# Patient Record
Sex: Female | Born: 1950 | Race: White | Hispanic: No | Marital: Married | Smoking: Never smoker
Health system: Southern US, Community
[De-identification: ages and names within clinical notes are randomized; demographics above are authoritative.]

## PROBLEM LIST (undated history)

## (undated) DIAGNOSIS — E7800 Pure hypercholesterolemia, unspecified: Secondary | ICD-10-CM

## (undated) DIAGNOSIS — R002 Palpitations: Secondary | ICD-10-CM

## (undated) DIAGNOSIS — W19XXXA Unspecified fall, initial encounter: Secondary | ICD-10-CM

## (undated) DIAGNOSIS — E079 Disorder of thyroid, unspecified: Secondary | ICD-10-CM

## (undated) DIAGNOSIS — K222 Esophageal obstruction: Secondary | ICD-10-CM

## (undated) DIAGNOSIS — B009 Herpesviral infection, unspecified: Secondary | ICD-10-CM

## (undated) DIAGNOSIS — S42309A Unspecified fracture of shaft of humerus, unspecified arm, initial encounter for closed fracture: Secondary | ICD-10-CM

## (undated) DIAGNOSIS — R011 Cardiac murmur, unspecified: Secondary | ICD-10-CM

## (undated) DIAGNOSIS — D649 Anemia, unspecified: Secondary | ICD-10-CM

## (undated) HISTORY — DX: Cardiac murmur, unspecified: R01.1

## (undated) HISTORY — DX: Palpitations: R00.2

## (undated) HISTORY — DX: Herpesviral infection, unspecified: B00.9

## (undated) HISTORY — DX: Disorder of thyroid, unspecified: E07.9

## (undated) HISTORY — DX: Pure hypercholesterolemia, unspecified: E78.00

## (undated) HISTORY — DX: Unspecified fracture of shaft of humerus, unspecified arm, initial encounter for closed fracture: S42.309A

## (undated) HISTORY — DX: Anemia, unspecified: D64.9

## (undated) HISTORY — PX: OTHER SURGICAL HISTORY: SHX169

## (undated) HISTORY — DX: Esophageal obstruction: K22.2

---

## 1987-06-02 HISTORY — PX: PELVIC LAPAROSCOPY: SHX162

## 1998-05-07 ENCOUNTER — Other Ambulatory Visit: Admission: RE | Admit: 1998-05-07 | Discharge: 1998-05-07 | Payer: Self-pay | Admitting: Obstetrics and Gynecology

## 1999-07-21 ENCOUNTER — Encounter (INDEPENDENT_AMBULATORY_CARE_PROVIDER_SITE_OTHER): Payer: Self-pay | Admitting: Specialist

## 1999-07-21 ENCOUNTER — Other Ambulatory Visit: Admission: RE | Admit: 1999-07-21 | Discharge: 1999-07-21 | Payer: Self-pay | Admitting: Obstetrics and Gynecology

## 2000-02-17 ENCOUNTER — Other Ambulatory Visit: Admission: RE | Admit: 2000-02-17 | Discharge: 2000-02-17 | Payer: Self-pay | Admitting: Obstetrics and Gynecology

## 2000-07-28 ENCOUNTER — Encounter (INDEPENDENT_AMBULATORY_CARE_PROVIDER_SITE_OTHER): Payer: Self-pay | Admitting: Specialist

## 2000-07-28 ENCOUNTER — Ambulatory Visit (HOSPITAL_COMMUNITY): Admission: RE | Admit: 2000-07-28 | Discharge: 2000-07-28 | Payer: Self-pay | Admitting: Gastroenterology

## 2001-02-17 ENCOUNTER — Other Ambulatory Visit: Admission: RE | Admit: 2001-02-17 | Discharge: 2001-02-17 | Payer: Self-pay | Admitting: Obstetrics and Gynecology

## 2001-07-28 ENCOUNTER — Emergency Department (HOSPITAL_COMMUNITY): Admission: EM | Admit: 2001-07-28 | Discharge: 2001-07-28 | Payer: Self-pay

## 2002-02-17 ENCOUNTER — Other Ambulatory Visit: Admission: RE | Admit: 2002-02-17 | Discharge: 2002-02-17 | Payer: Self-pay | Admitting: Obstetrics and Gynecology

## 2003-02-27 ENCOUNTER — Encounter: Admission: RE | Admit: 2003-02-27 | Discharge: 2003-05-28 | Payer: Self-pay | Admitting: Obstetrics and Gynecology

## 2003-02-28 ENCOUNTER — Other Ambulatory Visit: Admission: RE | Admit: 2003-02-28 | Discharge: 2003-02-28 | Payer: Self-pay | Admitting: Obstetrics and Gynecology

## 2004-01-31 ENCOUNTER — Ambulatory Visit (HOSPITAL_COMMUNITY): Admission: RE | Admit: 2004-01-31 | Discharge: 2004-01-31 | Payer: Self-pay | Admitting: Gastroenterology

## 2004-01-31 ENCOUNTER — Encounter (INDEPENDENT_AMBULATORY_CARE_PROVIDER_SITE_OTHER): Payer: Self-pay | Admitting: Specialist

## 2004-02-29 ENCOUNTER — Other Ambulatory Visit: Admission: RE | Admit: 2004-02-29 | Discharge: 2004-02-29 | Payer: Self-pay | Admitting: Obstetrics and Gynecology

## 2005-03-09 ENCOUNTER — Other Ambulatory Visit: Admission: RE | Admit: 2005-03-09 | Discharge: 2005-03-09 | Payer: Self-pay | Admitting: Obstetrics and Gynecology

## 2005-06-25 ENCOUNTER — Encounter: Admission: RE | Admit: 2005-06-25 | Discharge: 2005-09-23 | Payer: Self-pay | Admitting: Obstetrics and Gynecology

## 2005-10-27 ENCOUNTER — Encounter: Payer: Self-pay | Admitting: Cardiology

## 2006-03-17 ENCOUNTER — Other Ambulatory Visit: Admission: RE | Admit: 2006-03-17 | Discharge: 2006-03-17 | Payer: Self-pay | Admitting: Obstetrics and Gynecology

## 2006-06-18 ENCOUNTER — Ambulatory Visit: Payer: Self-pay | Admitting: Internal Medicine

## 2006-09-06 ENCOUNTER — Ambulatory Visit: Payer: Self-pay | Admitting: Internal Medicine

## 2006-11-11 ENCOUNTER — Emergency Department (HOSPITAL_COMMUNITY): Admission: EM | Admit: 2006-11-11 | Discharge: 2006-11-11 | Payer: Self-pay | Admitting: Emergency Medicine

## 2006-11-12 ENCOUNTER — Emergency Department (HOSPITAL_COMMUNITY): Admission: EM | Admit: 2006-11-12 | Discharge: 2006-11-12 | Payer: Self-pay | Admitting: Emergency Medicine

## 2007-03-22 ENCOUNTER — Other Ambulatory Visit: Admission: RE | Admit: 2007-03-22 | Discharge: 2007-03-22 | Payer: Self-pay | Admitting: Obstetrics and Gynecology

## 2007-06-01 ENCOUNTER — Encounter: Payer: Self-pay | Admitting: Cardiovascular Disease

## 2007-06-01 ENCOUNTER — Encounter: Payer: Self-pay | Admitting: Cardiology

## 2008-04-16 ENCOUNTER — Other Ambulatory Visit: Admission: RE | Admit: 2008-04-16 | Discharge: 2008-04-16 | Payer: Self-pay | Admitting: Obstetrics and Gynecology

## 2008-04-16 ENCOUNTER — Encounter: Payer: Self-pay | Admitting: Obstetrics and Gynecology

## 2008-04-16 ENCOUNTER — Ambulatory Visit: Payer: Self-pay | Admitting: Obstetrics and Gynecology

## 2008-04-17 ENCOUNTER — Ambulatory Visit: Payer: Self-pay | Admitting: Obstetrics and Gynecology

## 2008-08-21 ENCOUNTER — Ambulatory Visit: Payer: Self-pay | Admitting: Obstetrics and Gynecology

## 2008-12-25 ENCOUNTER — Ambulatory Visit: Payer: Self-pay | Admitting: Obstetrics and Gynecology

## 2009-04-30 ENCOUNTER — Ambulatory Visit: Payer: Self-pay | Admitting: Obstetrics and Gynecology

## 2009-04-30 ENCOUNTER — Other Ambulatory Visit: Admission: RE | Admit: 2009-04-30 | Discharge: 2009-04-30 | Payer: Self-pay | Admitting: Obstetrics and Gynecology

## 2009-09-10 ENCOUNTER — Ambulatory Visit: Payer: Self-pay | Admitting: Obstetrics and Gynecology

## 2010-03-10 ENCOUNTER — Ambulatory Visit: Payer: Self-pay | Admitting: Obstetrics and Gynecology

## 2010-05-06 ENCOUNTER — Other Ambulatory Visit
Admission: RE | Admit: 2010-05-06 | Discharge: 2010-05-06 | Payer: Self-pay | Source: Home / Self Care | Admitting: Obstetrics and Gynecology

## 2010-05-06 ENCOUNTER — Ambulatory Visit: Payer: Self-pay | Admitting: Obstetrics and Gynecology

## 2010-05-07 ENCOUNTER — Ambulatory Visit: Payer: Self-pay | Admitting: Obstetrics and Gynecology

## 2010-09-19 ENCOUNTER — Ambulatory Visit (INDEPENDENT_AMBULATORY_CARE_PROVIDER_SITE_OTHER): Payer: BC Managed Care – PPO | Admitting: Gynecology

## 2010-09-19 DIAGNOSIS — N63 Unspecified lump in unspecified breast: Secondary | ICD-10-CM

## 2010-09-25 ENCOUNTER — Telehealth: Payer: Self-pay | Admitting: Cardiology

## 2010-09-25 NOTE — Telephone Encounter (Signed)
Sleep Apnia is getting worse. Wanted to speak with someone about getting a referral to a sleep apnia specialist. Please call back.

## 2010-09-30 NOTE — Telephone Encounter (Signed)
Left a message on 4/27 giving patient information of who we referred her to previously.  That appointment she cancelled, however I asked her to call them regarding rescheduling.  I asked her to call back if there was a problem with getting the appointment.  As of today, I have not heard back from the patient.  Pulmonologist referred to was Dr. Melba Coon.

## 2010-10-03 ENCOUNTER — Other Ambulatory Visit: Payer: Self-pay | Admitting: *Deleted

## 2010-10-03 DIAGNOSIS — G4700 Insomnia, unspecified: Secondary | ICD-10-CM

## 2010-10-03 MED ORDER — ZOLPIDEM TARTRATE 5 MG PO TABS: 5.0000 mg | ORAL_TABLET | Freq: Every evening | ORAL | Status: DC | PRN

## 2010-10-03 NOTE — Telephone Encounter (Signed)
Refilled meds per fax request.  

## 2010-10-17 NOTE — Op Note (Signed)
NAME:  Kelly Hahn, LANGHORNE                     ACCOUNT NO.:  [id number]   MEDICAL RECORD NO.:  [id number]                   PATIENT TYPE:  AMB   LOCATION:  ENDO                                 FACILITY:  [facility]   PHYSICIAN:  Bernette Redbird, M.D.                DATE OF BIRTH:  22-Jan-1951   DATE OF PROCEDURE:  01/31/2004  DATE OF DISCHARGE:                                 OPERATIVE REPORT   PROCEDURE:  Colonoscopy with biopsies.   INDICATION:  A 60 year old with family history of colon cancer in remote  relatives and a personal history of a small colonic adenoma having been  removed at the time of her previous colonoscopy about 3 years ago.   FINDINGS:  Diminutive rectal polyp, removed.   DESCRIPTION OF PROCEDURE:  The nature, purpose, and risks of the procedure  were familiar to the patient from prior examination.  She provided written  consent.  Sedation was fentanyl 62.5 mcg and Versed 8 mg IV without  arrhythmias or desaturation.  The Olympus adjustable tension pediatric video  colonoscope was advanced with just a little bit of looping overcome by  external abdominal compression, reaching the terminal ileum which had a  normal appearance, and pullback was then performed.   The quality of the prep was excellent, and it is felt that all areas are  well seen.   There was a tiny, 2-3 mm hyperplastic-appearing sessile polyp on a fold in  the rectum at about 12 cm, removed by a couple of cold biopsies.  No other  polyps were seen, and there was no evidence of cancer, colitis, vascular  malformations, or diverticulosis.  Retroflexion in the rectum was  unremarkable.   The patient tolerated the procedure well, and there were no apparent  complications.   IMPRESSION:  1.  Diminutive rectal polyp (211.4).  2.  Prior history of colonic  adenoma.  3.  Family history of colon cancer.   PLAN:  Await pathology on the polyp with anticipated colonoscopic follow up  in 5 years in view of  the prior history of an adenoma having been removed,  and in view of the family history of colon cancer.                                               Bernette Redbird, M.D.    RB/MEDQ  D:  01/31/2004  T:  01/31/2004  Job:  [id number]   cc:   Reuel Boom L. Eda Paschal, M.D.  [address]  [place]  [place] [address]  Fax: [phone]   Titus Dubin. Alwyn Ren, M.D. [facility]

## 2010-10-17 NOTE — Procedures (Signed)
[facility]  Patient:    Kelly Hahn, Kelly Hahn                  MRN: [id number] Proc. Date: 07/28/00 Adm. Date:  [id number] Attending:  Rich Brave CC:         Rande Brunt. Eda Paschal, M.D.   Procedure Report  PROCEDURE:  Colonoscopy with polypectomy.  ENDOSCOPIST:  Florencia Reasons, M.D.  INDICATIONS:  A 60 year old female for colon cancer screening.  There is a family history of colorectal neoplasia in her maternal aunt and maternal grandmother.  The patient herself is asymptomatic.  FINDINGS:  Medium size polyp at 25 cm, removed by snare technique.  DESCRIPTION OF PROCEDURE:  The nature, purpose, and risks of the procedure had been discussed with the patient who provided written consent.  Sedation was fentanyl 62.5 mcg and Versed 8 mg IV without arrhythmias or desaturation.  The Olympus pediatric video colonoscope was advanced with some looping to the cecum as identified by the clear visualization of the appendiceal orifice and the absence of further lumen.  Pullback was then performed.  The quality of the prep was excellent, and it is felt that all areas were well seen.  There was an 8 mm polyp snared at about 25 cm.  It was retrieved by suctioning through the scope.  There was complete hemostasis and no evidence of excessive cautery.  This was otherwise a normal examination, without other polyps being seen, and without any evidence of cancer, colitis, vascular malformations, or diverticulosis.  Retroflexion in the rectum was normal.  The patient tolerated the procedure well, and there were no apparent complications.  IMPRESSION:  Sigmoid polyp removed as described above.  PLAN:  Await pathology results. DD:  07/28/00 TD:  07/28/00 Job: [id number]

## 2010-12-26 ENCOUNTER — Other Ambulatory Visit: Payer: Self-pay | Admitting: Family Medicine

## 2010-12-26 ENCOUNTER — Ambulatory Visit
Admission: RE | Admit: 2010-12-26 | Discharge: 2010-12-26 | Disposition: A | Discharge status: Home / Self Care | Payer: BC Managed Care – PPO | Source: Ambulatory Visit | Attending: Family Medicine | Admitting: Family Medicine

## 2010-12-26 DIAGNOSIS — R413 Other amnesia: Secondary | ICD-10-CM

## 2010-12-26 DIAGNOSIS — R410 Disorientation, unspecified: Secondary | ICD-10-CM

## 2010-12-26 MED ORDER — IOHEXOL 300 MG/ML  SOLN
75.0000 mL | Freq: Once | INTRAMUSCULAR | Status: AC | PRN
  Administered 2010-12-26: 75 mL via INTRAVENOUS

## 2011-01-07 ENCOUNTER — Telehealth: Payer: Self-pay | Admitting: Cardiology

## 2011-01-07 DIAGNOSIS — F419 Anxiety disorder, unspecified: Secondary | ICD-10-CM

## 2011-01-07 NOTE — Telephone Encounter (Signed)
Called because she is not comfortable taking the zanex and would like your opinion on taking Adavan. Please call back. I have pulled her chart.

## 2011-01-07 NOTE — Telephone Encounter (Signed)
Please advise 

## 2011-01-07 NOTE — Telephone Encounter (Signed)
Okay to try Ativan 0.5 mg bid prn anxiety #60 ref 2

## 2011-01-07 NOTE — Telephone Encounter (Signed)
Advised patient

## 2011-01-08 MED ORDER — LORAZEPAM 0.5 MG PO TABS: 0.5000 mg | ORAL_TABLET | Freq: Two times a day (BID) | ORAL | Status: AC | PRN

## 2011-03-25 ENCOUNTER — Other Ambulatory Visit: Payer: Self-pay | Admitting: Dermatology

## 2011-05-08 ENCOUNTER — Ambulatory Visit (INDEPENDENT_AMBULATORY_CARE_PROVIDER_SITE_OTHER): Payer: BC Managed Care – PPO

## 2011-05-08 DIAGNOSIS — R509 Fever, unspecified: Secondary | ICD-10-CM

## 2011-05-08 DIAGNOSIS — R05 Cough: Secondary | ICD-10-CM

## 2011-05-08 DIAGNOSIS — R059 Cough, unspecified: Secondary | ICD-10-CM

## 2011-05-08 DIAGNOSIS — IMO0001 Reserved for inherently not codable concepts without codable children: Secondary | ICD-10-CM

## 2011-05-12 ENCOUNTER — Other Ambulatory Visit: Payer: Self-pay | Admitting: Emergency Medicine

## 2011-05-12 DIAGNOSIS — E041 Nontoxic single thyroid nodule: Secondary | ICD-10-CM

## 2011-05-13 ENCOUNTER — Ambulatory Visit
Admission: RE | Admit: 2011-05-13 | Discharge: 2011-05-13 | Disposition: A | Discharge status: Home / Self Care | Payer: BC Managed Care – PPO | Source: Ambulatory Visit | Attending: Emergency Medicine | Admitting: Emergency Medicine

## 2011-05-13 DIAGNOSIS — E041 Nontoxic single thyroid nodule: Secondary | ICD-10-CM

## 2011-05-21 ENCOUNTER — Encounter: Payer: BC Managed Care – PPO | Admitting: Obstetrics and Gynecology

## 2011-05-22 ENCOUNTER — Ambulatory Visit (INDEPENDENT_AMBULATORY_CARE_PROVIDER_SITE_OTHER): Payer: BC Managed Care – PPO

## 2011-05-22 DIAGNOSIS — R110 Nausea: Secondary | ICD-10-CM

## 2011-05-29 ENCOUNTER — Encounter: Payer: Self-pay | Admitting: *Deleted

## 2011-05-29 DIAGNOSIS — N809 Endometriosis, unspecified: Secondary | ICD-10-CM | POA: Insufficient documentation

## 2011-06-04 ENCOUNTER — Ambulatory Visit (INDEPENDENT_AMBULATORY_CARE_PROVIDER_SITE_OTHER): Payer: BC Managed Care – PPO | Admitting: Obstetrics and Gynecology

## 2011-06-04 ENCOUNTER — Encounter: Payer: Self-pay | Admitting: Obstetrics and Gynecology

## 2011-06-04 VITALS — BP 112/66 | Ht 64.5 in | Wt 134.0 lb

## 2011-06-04 DIAGNOSIS — M810 Age-related osteoporosis without current pathological fracture: Secondary | ICD-10-CM

## 2011-06-04 DIAGNOSIS — Z01419 Encounter for gynecological examination (general) (routine) without abnormal findings: Secondary | ICD-10-CM

## 2011-06-04 LAB — URINALYSIS, ROUTINE W REFLEX MICROSCOPIC
Bilirubin Urine: NEGATIVE
Glucose, UA: NEGATIVE mg/dL
Hgb urine dipstick: NEGATIVE
Ketones, ur: NEGATIVE mg/dL
Leukocytes, UA: NEGATIVE
Nitrite: NEGATIVE
Protein, ur: NEGATIVE mg/dL
Specific Gravity, Urine: 1.03 (ref 1.005–1.030)
Urobilinogen, UA: 0.2 mg/dL (ref 0.0–1.0)
pH: 5.5 (ref 5.0–8.0)

## 2011-06-04 NOTE — Progress Notes (Signed)
Patient came to see me today for her annual GYN exam. She is doing well without menopausal symptoms. She continues to morn the death of her son. She is having no vaginal bleeding. She is having no pelvic pain. She did have a fracture this year of her arm but it was a traumatic fracture. She continues to take calcium and vitamin D. She told me today that she is still not willing to take medicine for her osteoporosis. She is well aware of the risks of a fracture. She is due for yearly mammogram. Dr. Cleta Alberts found some small thyroid nodules that he is watching. She had an ultrasound. She is going to back in 6 months for followup ultra sound. Her thyroid function was normal.  Physical examination: Kennon Portela present. HEENT within normal limits. Neck: Thyroid slightly enlarged Supraclavicular nodes: not enlarged. Breasts: Examined in both sitting midline position. No skin changes and no masses. Abdomen: Soft no guarding rebound or masses or hernia. Pelvic: External: Within normal limits. BUS: Within normal limits. Vaginal:within normal limits. Good estrogen effect. No evidence of cystocele rectocele or enterocele. Cervix: clean. Uterus: Normal size and shape. Adnexa: No masses. Rectovaginal exam: Confirmatory and negative. Extremities: Within normal limits.  Assessment: Normal GYN exam. Small thyroid nodules.  Plan: As noted above I once again encouraged patient to take medication for osteoporosis. She declined. She will continue Dr. Cleta Alberts follow her thyroid. She will get yearly mammogram. She will return fasting tomorrow for her lab work.

## 2011-06-05 ENCOUNTER — Other Ambulatory Visit: Payer: BC Managed Care – PPO

## 2011-06-05 DIAGNOSIS — M810 Age-related osteoporosis without current pathological fracture: Secondary | ICD-10-CM

## 2011-06-05 DIAGNOSIS — Z01419 Encounter for gynecological examination (general) (routine) without abnormal findings: Secondary | ICD-10-CM

## 2011-06-05 LAB — LIPID PANEL
Cholesterol: 241 mg/dL — ABNORMAL HIGH (ref 0–200)
HDL: 58 mg/dL (ref >39)
LDL Cholesterol: 160 mg/dL — ABNORMAL HIGH (ref 0–99)
Total CHOL/HDL Ratio: 4.2 Ratio
Triglycerides: 116 mg/dL (ref <150)
VLDL: 23 mg/dL (ref 0–40)

## 2011-06-05 LAB — CBC WITH DIFFERENTIAL/PLATELET
Basophils Absolute: 0 10*3/uL (ref 0.0–0.1)
Basophils Relative: 0 % (ref 0–1)
Eosinophils Absolute: 0.2 10*3/uL (ref 0.0–0.7)
Eosinophils Relative: 3 % (ref 0–5)
HCT: 43.1 % (ref 36.0–46.0)
Hemoglobin: 13.7 g/dL (ref 12.0–15.0)
Lymphocytes Relative: 42 % (ref 12–46)
Lymphs Abs: 2.4 10*3/uL (ref 0.7–4.0)
MCH: 27.1 pg (ref 26.0–34.0)
MCHC: 31.8 g/dL (ref 30.0–36.0)
MCV: 85.2 fL (ref 78.0–100.0)
Monocytes Absolute: 0.6 10*3/uL (ref 0.1–1.0)
Monocytes Relative: 11 % (ref 3–12)
Neutro Abs: 2.5 10*3/uL (ref 1.7–7.7)
Neutrophils Relative %: 44 % (ref 43–77)
Platelets: 283 10*3/uL (ref 150–400)
RBC: 5.06 MIL/uL (ref 3.87–5.11)
RDW: 14.5 % (ref 11.5–15.5)
WBC: 5.7 10*3/uL (ref 4.0–10.5)

## 2011-06-06 LAB — VITAMIN D 25 HYDROXY (VIT D DEFICIENCY, FRACTURES): Vit D, 25-Hydroxy: 57 ng/mL (ref 30–89)

## 2011-06-16 ENCOUNTER — Encounter: Payer: Self-pay | Admitting: Obstetrics and Gynecology

## 2011-07-22 ENCOUNTER — Telehealth: Payer: Self-pay | Admitting: Cardiology

## 2011-07-22 NOTE — Telephone Encounter (Signed)
New Problem  Please return call to patient regarding Sleep study appnt, she can be reached on cell# [phone]

## 2011-07-22 NOTE — Telephone Encounter (Signed)
Patient see's Dr Cleta Alberts for PCP and will call them and request them make the referral.  We have not seen the patient in several years and chart no longer on site.  Patient stated she would call Dr Ellis Parents office

## 2011-07-24 ENCOUNTER — Telehealth: Payer: Self-pay

## 2011-07-24 NOTE — Telephone Encounter (Signed)
Pt is calling to speak with someone about her sleep apnea problems she is waking up at night and has stopped breathing  Best number [phone]

## 2011-07-26 NOTE — Telephone Encounter (Signed)
LMOM to CB. 

## 2011-07-27 ENCOUNTER — Telehealth: Payer: Self-pay

## 2011-07-27 NOTE — Telephone Encounter (Signed)
.  [facility]     PT IS REQUESTING REFERAL FROM DR DAUB FOR SLEEP APNEA   BEST PHONE [phone]

## 2011-07-27 NOTE — Telephone Encounter (Signed)
Spoke with Kelly Hahn and advised to RTC to see Dr. Cleta Alberts first and then possibly refer her to have a sleep study done. Transferred to 104 to make appt

## 2011-09-17 DIAGNOSIS — R0683 Snoring: Secondary | ICD-10-CM | POA: Insufficient documentation

## 2011-10-14 ENCOUNTER — Encounter: Payer: Self-pay | Admitting: *Deleted

## 2011-12-08 ENCOUNTER — Ambulatory Visit (INDEPENDENT_AMBULATORY_CARE_PROVIDER_SITE_OTHER): Payer: BC Managed Care – PPO | Admitting: Emergency Medicine

## 2011-12-08 VITALS — BP 103/68 | HR 61 | Temp 97.4°F | Resp 16 | Ht 65.5 in | Wt 130.0 lb

## 2011-12-08 DIAGNOSIS — Z634 Disappearance and death of family member: Secondary | ICD-10-CM | POA: Insufficient documentation

## 2011-12-08 DIAGNOSIS — Z0000 Encounter for general adult medical examination without abnormal findings: Secondary | ICD-10-CM

## 2011-12-08 DIAGNOSIS — E049 Nontoxic goiter, unspecified: Secondary | ICD-10-CM

## 2011-12-08 DIAGNOSIS — R945 Abnormal results of liver function studies: Secondary | ICD-10-CM

## 2011-12-08 DIAGNOSIS — I493 Ventricular premature depolarization: Secondary | ICD-10-CM | POA: Insufficient documentation

## 2011-12-08 DIAGNOSIS — E785 Hyperlipidemia, unspecified: Secondary | ICD-10-CM

## 2011-12-08 DIAGNOSIS — F4321 Adjustment disorder with depressed mood: Secondary | ICD-10-CM | POA: Insufficient documentation

## 2011-12-08 LAB — IBC PANEL
%SAT: 33 % (ref 20–55)
TIBC: 412 ug/dL (ref 250–470)
UIBC: 277 ug/dL (ref 125–400)

## 2011-12-08 LAB — CBC WITH DIFFERENTIAL/PLATELET
Basophils Absolute: 0 10*3/uL (ref 0.0–0.1)
Basophils Relative: 1 % (ref 0–1)
Eosinophils Absolute: 0.1 10*3/uL (ref 0.0–0.7)
Eosinophils Relative: 1 % (ref 0–5)
HCT: 42 % (ref 36.0–46.0)
Hemoglobin: 14.4 g/dL (ref 12.0–15.0)
Lymphocytes Relative: 22 % (ref 12–46)
Lymphs Abs: 1.3 10*3/uL (ref 0.7–4.0)
MCH: 27.3 pg (ref 26.0–34.0)
MCHC: 34.3 g/dL (ref 30.0–36.0)
MCV: 79.5 fL (ref 78.0–100.0)
Monocytes Absolute: 0.5 10*3/uL (ref 0.1–1.0)
Monocytes Relative: 8 % (ref 3–12)
Neutro Abs: 3.9 10*3/uL (ref 1.7–7.7)
Neutrophils Relative %: 68 % (ref 43–77)
Platelets: 278 10*3/uL (ref 150–400)
RBC: 5.28 MIL/uL — ABNORMAL HIGH (ref 3.87–5.11)
RDW: 13.5 % (ref 11.5–15.5)
WBC: 5.8 10*3/uL (ref 4.0–10.5)

## 2011-12-08 LAB — POCT URINALYSIS DIPSTICK
Bilirubin, UA: NEGATIVE
Blood, UA: NEGATIVE
Glucose, UA: NEGATIVE
Ketones, UA: NEGATIVE
Leukocytes, UA: NEGATIVE
Nitrite, UA: NEGATIVE
Protein, UA: NEGATIVE
Spec Grav, UA: 1.015
Urobilinogen, UA: 0.2
pH, UA: 7

## 2011-12-08 LAB — COMPREHENSIVE METABOLIC PANEL
ALT: 26 U/L (ref 0–35)
AST: 24 U/L (ref 0–37)
Albumin: 4.4 g/dL (ref 3.5–5.2)
Alkaline Phosphatase: 70 U/L (ref 39–117)
BUN: 15 mg/dL (ref 6–23)
CO2: 28 mEq/L (ref 19–32)
Calcium: 9.5 mg/dL (ref 8.4–10.5)
Chloride: 104 mEq/L (ref 96–112)
Creat: 0.62 mg/dL (ref 0.50–1.10)
Glucose, Bld: 93 mg/dL (ref 70–99)
Potassium: 4.3 mEq/L (ref 3.5–5.3)
Sodium: 140 mEq/L (ref 135–145)
Total Bilirubin: 0.6 mg/dL (ref 0.3–1.2)
Total Protein: 6.5 g/dL (ref 6.0–8.3)

## 2011-12-08 LAB — FERRITIN: Ferritin: 17 ng/mL (ref 10–291)

## 2011-12-08 LAB — TSH: TSH: 0.883 u[IU]/mL (ref 0.350–4.500)

## 2011-12-08 LAB — T4, FREE: Free T4: 1.4 ng/dL (ref 0.80–1.80)

## 2011-12-08 LAB — IRON: Iron: 135 ug/dL (ref 42–145)

## 2011-12-08 NOTE — Progress Notes (Signed)
  Subjective:    Patient ID: Kelly Hahn, female    DOB: 1951/01/31, 61 y.o.   MRN: [id number]  HPI    Review of Systems  Constitutional: Negative.   HENT: Negative.   Eyes: Negative.   Respiratory: Negative.   Cardiovascular: Negative.   Gastrointestinal: Negative.   Genitourinary: Negative.   Skin: Negative.   Neurological: Negative.   Hematological: Negative.   Psychiatric/Behavioral: Negative.    patient had recent screening of liver function tests they were found to be elevated. She also has had some recent problems with hair loss and is concerned about possible iron deficiency anemia versus thyroid disease to she also was noted on her EKG to have a PVC and like this further evaluated.     Objective:   Physical Exam HEENT exam is unremarkable her neck is supple his thyroid is diffusely enlarged with nodularity. Chest is clear to auscultation and percussion. Heart regular rate no murmurs rubs or gallops. Breasts are without tenderness or masses. The abdomen is soft there scars present from previous abdominal surgery extremity exam reveals pulses 2+ no swelling        Assessment & Plan:  Patient does have a goiter that needs further evaluation by ultrasound in December. Previous ultrasound showed what appeared to be a multinodular goiter but thyroid studies were normal. Patient also needs a liposciences test to see about her cholesterol and LDL. Patient is requesting ferritin level done because she wants to see if this is the source of her hair loss. Patient would also like to repeat LFTs because of her previous elevation I also will request an EKG because her PVCs

## 2011-12-08 NOTE — Patient Instructions (Addendum)
Make an appointment to see me in December so we can schedule another ultrasound of your thyroid. I will make an appointment to see the cardiologist to evaluate your skipped beats. I will call you when I get your test results back. The special lipid tests take about 3 weeksCholesterol Cholesterol is a white, waxy, fat-like protein needed by your body in small amounts. The liver makes all the cholesterol you need. It is carried from the liver by the blood through the blood vessels. Deposits (plaque) may build up on blood vessel walls. This makes the arteries narrower and stiffer. Plaque increases the risk for heart attack and stroke. You cannot feel your cholesterol level even if it is very high. The only way to know is by a blood test to check your lipid (fats) levels. Once you know your cholesterol levels, you should keep a record of the test results. Work with your caregiver to to keep your levels in the desired range. WHAT THE RESULTS MEAN:  Total cholesterol is a rough measure of all the cholesterol in your blood.   LDL is the so-called bad cholesterol. This is the type that deposits cholesterol in the walls of the arteries. You want this level to be low.   HDL is the good cholesterol because it cleans the arteries and carries the LDL away. You want this level to be high.   Triglycerides are fat that the body can either burn for energy or store. High levels are closely linked to heart disease.  DESIRED LEVELS:  Total cholesterol below 200.   LDL below 100 for people at risk, below 70 for very high risk.   HDL above 50 is good, above 60 is best.   Triglycerides below 150.  HOW TO LOWER YOUR CHOLESTEROL:  Diet.   Choose fish or white meat chicken and [place], roasted or baked. Limit fatty cuts of red meat, fried foods, and processed meats, such as sausage and lunch meat.   Eat lots of fresh fruits and vegetables. Choose whole grains, beans, pasta, potatoes and cereals.   Use only small  amounts of olive, corn or canola oils. Avoid butter, mayonnaise, shortening or palm kernel oils. Avoid foods with trans-fats.   Use skim/nonfat milk and low-fat/nonfat yogurt and cheeses. Avoid whole milk, cream, ice cream, egg yolks and cheeses. Healthy desserts include angel food cake, ginger snaps, animal crackers, hard candy, popsicles, and low-fat/nonfat frozen yogurt. Avoid pastries, cakes, pies and cookies.   Exercise.   A regular program helps decrease LDL and raises HDL.   Helps with weight control.   Do things that increase your activity level like gardening, walking, or taking the stairs.   Medication.   May be prescribed by your caregiver to help lowering cholesterol and the risk for heart disease.   You may need medicine even if your levels are normal if you have several risk factors.  HOME CARE INSTRUCTIONS   Follow your diet and exercise programs as suggested by your caregiver.   Take medications as directed.   Have blood work done when your caregiver feels it is necessary.  MAKE SURE YOU:   Understand these instructions.   Will watch your condition.   Will get help right away if you are not doing well or get worse.  Document Released: 02/10/2001 Document Revised: 05/07/2011 Document Reviewed: 08/03/2007 [facility] Patient Information 2012 [place], [place].

## 2011-12-08 NOTE — Progress Notes (Signed)
  EKG normal except for PVCs

## 2011-12-09 LAB — VITAMIN D 25 HYDROXY (VIT D DEFICIENCY, FRACTURES): Vit D, 25-Hydroxy: 56 ng/mL (ref 30–89)

## 2011-12-15 ENCOUNTER — Encounter: Payer: Self-pay | Admitting: Emergency Medicine

## 2011-12-24 ENCOUNTER — Encounter: Payer: Self-pay | Admitting: Emergency Medicine

## 2011-12-24 ENCOUNTER — Telehealth: Payer: Self-pay | Admitting: Emergency Medicine

## 2011-12-24 NOTE — Telephone Encounter (Signed)
Please call patient and let her know I'm sending her a copy of her lipid profile. She needs to be on a statin. If she is willing, add Crestor 10 one a day #30 refill x11 recheck lipid panel 3 months she has a good percent of small molecular weight LDL particles which puts her at high risk for heart disease.

## 2011-12-28 MED ORDER — ROSUVASTATIN CALCIUM 10 MG PO TABS: 10.0000 mg | ORAL_TABLET | Freq: Every day | ORAL | Status: DC

## 2011-12-28 NOTE — Telephone Encounter (Signed)
I called her to advise and she is not wanting to go on a statin drug, she is going to think about it and call back if she wants to take the Crestor, but she wants to think about it, and does not think she wants to do this.

## 2011-12-28 NOTE — Telephone Encounter (Signed)
Spoke with pt, she does want to try Crestor for cholesterol. [facility]

## 2011-12-29 NOTE — Telephone Encounter (Signed)
Crestor requires a PA and since pt has not tried a generic statin, PA will not be approved. Dr Cleta Alberts consulted and changed Rx to Lipitor 40, but when called to notify pt, she checked on line while on the phone and doesn't want to change bc the Lipitor wasn't rated as well on line. Pt stated she will pay OOP for the Crestor. D/W pt diet/exercise changes she can try to help as well and transferred her to 104 to set up appt for 3 mos to recheck chol. Asked pt to CB if she finds that Crestor is too expensive and she wants to try the Lipitor after all.

## 2012-01-04 ENCOUNTER — Ambulatory Visit (INDEPENDENT_AMBULATORY_CARE_PROVIDER_SITE_OTHER): Payer: BC Managed Care – PPO | Admitting: Cardiovascular Disease

## 2012-01-04 ENCOUNTER — Encounter: Payer: Self-pay | Admitting: Cardiovascular Disease

## 2012-01-04 VITALS — BP 105/73 | HR 64 | Ht 65.0 in | Wt 129.0 lb

## 2012-01-04 DIAGNOSIS — I4949 Other premature depolarization: Secondary | ICD-10-CM

## 2012-01-04 DIAGNOSIS — E785 Hyperlipidemia, unspecified: Secondary | ICD-10-CM

## 2012-01-04 DIAGNOSIS — F4321 Adjustment disorder with depressed mood: Secondary | ICD-10-CM

## 2012-01-04 DIAGNOSIS — I493 Ventricular premature depolarization: Secondary | ICD-10-CM

## 2012-01-04 DIAGNOSIS — Z8249 Family history of ischemic heart disease and other diseases of the circulatory system: Secondary | ICD-10-CM

## 2012-01-04 NOTE — Assessment & Plan Note (Signed)
Benign no need for further w/u.  Asympotmatic

## 2012-01-04 NOTE — Patient Instructions (Signed)
Your physician recommends that you schedule a follow-up appointment in:  AS NEEDED Your physician recommends that you continue on your current medications as directed. Please refer to the Current Medication list given to you today.  CALCIUM SCORE   DX FAMILY HISTORY HEART DISEASE

## 2012-01-04 NOTE — Assessment & Plan Note (Signed)
She is reluctant to use statin.  Will order Calcium score to assess risk of CAD.  If score is 0 ok to try homeopathic drugs, diet and exercise rather than statin.

## 2012-01-04 NOTE — Progress Notes (Signed)
Patient ID: Kelly Hahn, female   DOB: Nov 17, 1950, 61 y.o.   MRN: [id number] Emmotional 61 yo referred by Dr Cleta Alberts for PVC and murmur.  She is still very depressed about her son dying 2 years ago.  He was bipolar and died somwhat tragically.  She had an isolated PVC on ECG.  ? Previous history of murmur.  No echo.  She ate poorly and gained weight during grieving process.  Is trying to do better now as part of recovery process.  Eating more Vegan.  Walking with no chest pain dyspnea or palpitations  She has a Chol of over 130 and elevated small particles.  Told to be on crestor for "high risk of MI" but she prefers more homeopathic Rx with diet and red yeast.  Positive family history and elevated cholesterol only risk factors.    ROS: Denies fever, malais, weight loss, blurry vision, decreased visual acuity, cough, sputum, SOB, hemoptysis, pleuritic pain, palpitaitons, heartburn, abdominal pain, melena, lower extremity edema, claudication, or rash.  All other systems reviewed and negative   General: Affect appropriate Healthy:  appears stated age HEENT: normal Neck supple with no adenopathy JVP normal no bruits no thyromegaly Lungs clear with no wheezing and good diaphragmatic motion Heart:  S1/S2 no murmur,rub, gallop or click PMI normal Abdomen: benighn, BS positve, no tenderness, no AAA no bruit.  No HSM or HJR Distal pulses intact with no bruits No edema Neuro non-focal Skin warm and dry No muscular weakness  Medications Current Outpatient Prescriptions  Medication Sig Dispense Refill  . aspirin 81 MG tablet Take 81 mg by mouth as needed.       . fish oil-omega-3 fatty acids 1000 MG capsule Take 2 g by mouth daily.        . Multiple Vitamin (MULTIVITAMIN) tablet Shaklee vit strip      . Red Yeast Rice Extract (RED YEAST RICE PO) Take 2 tablets by mouth daily.        Allergies Review of patient's allergies indicates no known allergies.  Family History: Family History    Problem Relation Age of Onset  . Hypertension Mother   . Diabetes Mother   . Osteoarthritis Mother   . Heart disease Mother   . Heart disease Father   . Alzheimer's disease Father   . Cancer Maternal Aunt     colon  . Breast cancer Paternal Aunt   . Heart disease Maternal Grandfather     Social History: History   Social History  . Marital Status: Married    Spouse Name: N/A    Number of Children: N/A  . Years of Education: N/A   Occupational History  . Not on file.   Social History Main Topics  . Smoking status: Never Smoker   . Smokeless tobacco: Never Used  . Alcohol Use: 1.8 oz/week    3 Glasses of wine per week     per week  . Drug Use: No  . Sexually Active: Yes    Birth Control/ Protection: Post-menopausal   Other Topics Concern  . Not on file   Social History Narrative  . No narrative on file    Electrocardiogram: 7/16  NSR rate 56  Isolated PVC otherwise normal.    Assessment and Plan

## 2012-01-04 NOTE — Assessment & Plan Note (Signed)
Still very emotional.  F/U primary major health issue

## 2012-01-05 ENCOUNTER — Ambulatory Visit (INDEPENDENT_AMBULATORY_CARE_PROVIDER_SITE_OTHER)
Admission: RE | Admit: 2012-01-05 | Discharge: 2012-01-05 | Disposition: A | Discharge status: Home / Self Care | Payer: BC Managed Care – PPO | Source: Ambulatory Visit | Attending: Cardiovascular Disease | Admitting: Cardiovascular Disease

## 2012-01-05 DIAGNOSIS — Z8249 Family history of ischemic heart disease and other diseases of the circulatory system: Secondary | ICD-10-CM

## 2012-02-25 ENCOUNTER — Other Ambulatory Visit: Payer: Self-pay | Admitting: Cardiology

## 2012-02-25 DIAGNOSIS — G4700 Insomnia, unspecified: Secondary | ICD-10-CM

## 2012-02-26 ENCOUNTER — Encounter: Payer: Self-pay | Admitting: Family Medicine

## 2012-02-26 DIAGNOSIS — R12 Heartburn: Secondary | ICD-10-CM | POA: Insufficient documentation

## 2012-02-26 DIAGNOSIS — R0683 Snoring: Secondary | ICD-10-CM

## 2012-03-01 ENCOUNTER — Other Ambulatory Visit: Payer: Self-pay | Admitting: *Deleted

## 2012-03-01 NOTE — Telephone Encounter (Signed)
Pharmacy call for Ambien 5MG .  Caralee Ates, CMA

## 2012-03-25 ENCOUNTER — Telehealth: Payer: Self-pay

## 2012-03-25 DIAGNOSIS — E785 Hyperlipidemia, unspecified: Secondary | ICD-10-CM

## 2012-03-25 NOTE — Telephone Encounter (Signed)
PT REQUESTS FUTURE LAB ORDER BE PLACED IN EPIC SO SHE CAN COME IN TO HAVE CHOLESTEROL LABS DONE THEN SHE WILL COME BACK AFTER HER LAB RESULTS COME BACK TO SEE DR DAUB   EXPLAINED NORMAL PROTOCOL WITH LAB DRAWS TO PT, BEING THERE IS NO FUTURE ORDERS IN PTS CHART, BUT PT DOES NOT WANT TO HAVE APPT WITH DR Cleta Alberts UNTIL AFTER HER LAB RESULTS COME BACK   PLEASE ADVISE PT CELL [phone]

## 2012-03-25 NOTE — Telephone Encounter (Signed)
Thanks, she has now decided she will just have the labs when she is here to see Dr Daub/ she advised she has not taken the Lipitor. Instead she has changed her diet and lost 20 lbs.

## 2012-03-25 NOTE — Telephone Encounter (Signed)
Future orders placed for fasting lipid panel.  Dr. Cleta Alberts has not indicated what, if any, other labs she needs, so there is a possibility that she will have to have more drawn at the time of her visit

## 2012-03-25 NOTE — Telephone Encounter (Signed)
Please advise if okay to do lab only visit for her.

## 2012-03-28 ENCOUNTER — Telehealth: Payer: Self-pay

## 2012-03-28 NOTE — Telephone Encounter (Signed)
Pt had to cancel her appt with dr Cleta Alberts on 03/29/12 due to jury duty. Before she reschedules, she  to come in only for blood work, and then return at a later date to dr daub when labs are back.  Pt wants to make sure there is an order in her chart for her to do that. Please call pt to advise @ [phone]

## 2012-03-28 NOTE — Telephone Encounter (Signed)
There is a future order in EPIC for a Lipid but according to the phone message that's all that can be future ordered. Pt is just going to make an appt with Dr. Cleta Alberts and have her blood drawn at that time.

## 2012-03-29 ENCOUNTER — Ambulatory Visit: Payer: BC Managed Care – PPO | Admitting: Emergency Medicine

## 2012-05-03 ENCOUNTER — Encounter: Payer: Self-pay | Admitting: Emergency Medicine

## 2012-05-03 ENCOUNTER — Ambulatory Visit (INDEPENDENT_AMBULATORY_CARE_PROVIDER_SITE_OTHER): Payer: BC Managed Care – PPO | Admitting: Emergency Medicine

## 2012-05-03 VITALS — BP 106/70 | HR 92 | Temp 97.7°F | Resp 16 | Ht 65.5 in | Wt 117.0 lb

## 2012-05-03 DIAGNOSIS — Z23 Encounter for immunization: Secondary | ICD-10-CM

## 2012-05-03 DIAGNOSIS — D509 Iron deficiency anemia, unspecified: Secondary | ICD-10-CM

## 2012-05-03 DIAGNOSIS — E785 Hyperlipidemia, unspecified: Secondary | ICD-10-CM

## 2012-05-03 DIAGNOSIS — E049 Nontoxic goiter, unspecified: Secondary | ICD-10-CM

## 2012-05-03 DIAGNOSIS — E611 Iron deficiency: Secondary | ICD-10-CM

## 2012-05-03 DIAGNOSIS — R7989 Other specified abnormal findings of blood chemistry: Secondary | ICD-10-CM

## 2012-05-03 DIAGNOSIS — L659 Nonscarring hair loss, unspecified: Secondary | ICD-10-CM | POA: Insufficient documentation

## 2012-05-03 LAB — CBC
HCT: 41.2 % (ref 36.0–46.0)
Hemoglobin: 13.7 g/dL (ref 12.0–15.0)
MCH: 27.3 pg (ref 26.0–34.0)
MCHC: 33.3 g/dL (ref 30.0–36.0)
MCV: 82.1 fL (ref 78.0–100.0)
Platelets: 270 10*3/uL (ref 150–400)
RBC: 5.02 MIL/uL (ref 3.87–5.11)
RDW: 14.2 % (ref 11.5–15.5)
WBC: 5.1 10*3/uL (ref 4.0–10.5)

## 2012-05-03 LAB — COMPREHENSIVE METABOLIC PANEL
ALT: 35 U/L (ref 0–35)
AST: 28 U/L (ref 0–37)
Albumin: 4.5 g/dL (ref 3.5–5.2)
Alkaline Phosphatase: 63 U/L (ref 39–117)
BUN: 15 mg/dL (ref 6–23)
CO2: 25 mEq/L (ref 19–32)
Calcium: 9.5 mg/dL (ref 8.4–10.5)
Chloride: 107 mEq/L (ref 96–112)
Creat: 0.66 mg/dL (ref 0.50–1.10)
Glucose, Bld: 83 mg/dL (ref 70–99)
Potassium: 3.8 mEq/L (ref 3.5–5.3)
Sodium: 142 mEq/L (ref 135–145)
Total Bilirubin: 0.6 mg/dL (ref 0.3–1.2)
Total Protein: 6.3 g/dL (ref 6.0–8.3)

## 2012-05-03 LAB — IRON AND TIBC
%SAT: 24 % (ref 20–55)
Iron: 94 ug/dL (ref 42–145)
TIBC: 384 ug/dL (ref 250–470)
UIBC: 290 ug/dL (ref 125–400)

## 2012-05-03 LAB — TSH: TSH: 1.136 u[IU]/mL (ref 0.350–4.500)

## 2012-05-03 LAB — LIPID PANEL
Cholesterol: 172 mg/dL (ref 0–200)
HDL: 50 mg/dL (ref >39)
LDL Cholesterol: 110 mg/dL — ABNORMAL HIGH (ref 0–99)
Total CHOL/HDL Ratio: 3.4 Ratio
Triglycerides: 61 mg/dL (ref <150)
VLDL: 12 mg/dL (ref 0–40)

## 2012-05-03 LAB — T4, FREE: Free T4: 1.44 ng/dL (ref 0.80–1.80)

## 2012-05-03 LAB — FERRITIN: Ferritin: 28 ng/mL (ref 10–291)

## 2012-05-03 NOTE — Progress Notes (Signed)
Pt refused Flu shot after I got it ready. She [facility]/Sub-Q injection. JF

## 2012-05-03 NOTE — Progress Notes (Signed)
  Subjective:    Patient ID: Kelly Hahn, female    DOB: 05-30-1951, 61 y.o.   MRN: [id number]  HPI Pt presents to clinic today mainly to check labs. She has lost 15-20 pounds since her last OV with [place] in July. She saw her Dermatologist for hair loss- He checked her Ferritin level and it was very low. Pt has changed her eating habits and began taking supplements (Shaklee) to lose weight. She's up-to-date on her mammograms. She is going to find a new gynecologist since Dr. Eda Paschal is retiring. She's been to see Dr. Karlyn Agee for hair loss and he recommended repeating ferritin level. She had a thyroid ultrasound done last year which showed multinodular nodules consistent with multinodular goiter.    Review of Systems     Objective:   Physical Exam patient is alert and cooperative in no distress. Her neck is supple. Her chest is clear to auscultation and percussion. Her cardiac exam is a regular rate without murmurs. Abdomen soft without tenderness. Extremities are without edema.        Assessment & Plan:  Kelly Hahn go ahead and check routine labs. I will go ahead and order an ultrasound to followup on the multinodular goiter she demonstrated a year ago. She was given a flu vaccine. We'll go ahead and get the results of her last colonoscopy she had by Dr. Matthias Hughs.

## 2012-05-10 ENCOUNTER — Ambulatory Visit: Payer: BC Managed Care – PPO | Admitting: Emergency Medicine

## 2012-05-19 ENCOUNTER — Ambulatory Visit
Admission: RE | Admit: 2012-05-19 | Discharge: 2012-05-19 | Disposition: A | Discharge status: Home / Self Care | Payer: BC Managed Care – PPO | Source: Ambulatory Visit | Attending: Emergency Medicine | Admitting: Emergency Medicine

## 2012-05-19 DIAGNOSIS — E049 Nontoxic goiter, unspecified: Secondary | ICD-10-CM

## 2012-05-20 ENCOUNTER — Telehealth: Payer: Self-pay | Admitting: Emergency Medicine

## 2012-05-20 NOTE — Telephone Encounter (Signed)
Called pt to advise. No answer, will try again.

## 2012-05-20 NOTE — Telephone Encounter (Signed)
Please call patient and let her know her thyroid scan did  not show any suspicious nodules for cancer. No change in treatment at the present time. A repeat ultrasound in one year.

## 2012-05-21 NOTE — Telephone Encounter (Signed)
Advised pt of notes 

## 2012-06-07 ENCOUNTER — Ambulatory Visit (INDEPENDENT_AMBULATORY_CARE_PROVIDER_SITE_OTHER): Payer: BC Managed Care – PPO | Admitting: Gynecology

## 2012-06-07 ENCOUNTER — Encounter: Payer: Self-pay | Admitting: Gynecology

## 2012-06-07 VITALS — BP 118/70 | Ht 64.5 in | Wt 117.0 lb

## 2012-06-07 DIAGNOSIS — M810 Age-related osteoporosis without current pathological fracture: Secondary | ICD-10-CM

## 2012-06-07 DIAGNOSIS — R634 Abnormal weight loss: Secondary | ICD-10-CM

## 2012-06-07 DIAGNOSIS — Z01419 Encounter for gynecological examination (general) (routine) without abnormal findings: Secondary | ICD-10-CM

## 2012-06-07 NOTE — Progress Notes (Addendum)
Kelly Hahn 1951/03/26 [id number]   History:    62 y.o.  for annual gyn exam who several years ago was diagnosed with osteoporosis and patient has refused to take medication for it. She is doing well without any menopausal symptoms. She continues to mourn the death of her son. She had a traumatic fracture of her arm in January 2013 but continues to take her calcium and vitamin D regularly. She has stated that she has lost 15 pounds over the course of the past 4 months intentionally by converting her diet to vegetables fruits and no meets. Patient been followed by her primary physician Dr. Cleta Alberts for multinodular goiter. She's had normal thyroid function test recently and he has done all her lab work to include a normal CBC normal anemia indices normal lipid profile with the exception of slightly elevated LDL at 110 which was elevated at 160 one  year ago.  Colonoscopy was normal in 2012, colon polyp 2002 Mammogram normal January 2013 Bone density study 2011 osteoporosis Patient would know prior history of abnormal Pap smears Patient has had her shingles vaccine but declined flu vaccine and not sure of the Tdap vaccine  1976 patient stated that she has some form of cervical dysplasia but does not recall there is any treatment that was done. She has had normal Pap smear since that time.  Past medical history,surgical history, family history and social history were all reviewed and documented in the EPIC chart.  Gynecologic History No LMP recorded. Patient is postmenopausal. Contraception: post menopausal status Last Pap: 2011. Results were: normal Last mammogram: 2013. Results were: normal  Obstetric History OB History    Grav Para Term Preterm Abortions TAB SAB Ect Mult Living   1 1 1        0     # Outc Date GA Lbr Len/2nd Wgt Sex Del Anes PTL Lv   1 TRM                ROS: A ROS was performed and pertinent positives and negatives are included in the history.  GENERAL: No fevers  or chills. HEENT: No change in vision, no earache, sore throat or sinus congestion. NECK: No pain or stiffness. CARDIOVASCULAR: No chest pain or pressure. No palpitations. PULMONARY: No shortness of breath, cough or wheeze. GASTROINTESTINAL: No abdominal pain, nausea, vomiting or diarrhea, melena or bright red blood per rectum. GENITOURINARY: No urinary frequency, urgency, hesitancy or dysuria. MUSCULOSKELETAL: No joint or muscle pain, no back pain, no recent trauma. DERMATOLOGIC: No rash, no itching, no lesions. ENDOCRINE: No polyuria, polydipsia, no heat or cold intolerance. No recent change in weight. HEMATOLOGICAL: No anemia or easy bruising or bleeding. NEUROLOGIC: No headache, seizures, numbness, tingling or weakness. PSYCHIATRIC: No depression, no loss of interest in normal activity or change in sleep pattern.     Exam: chaperone present  BP 118/70  Ht 5' 4.5" (1.638 m)  Wt 117 lb (53.071 kg)  BMI 19.77 kg/m2  Body mass index is 19.77 kg/(m^2).  General appearance : Well developed well nourished female. No acute distress HEENT: Neck supple, trachea midline, no carotid bruits, no thyroidmegaly Lungs: Clear to auscultation, no rhonchi or wheezes, or rib retractions  Heart: Regular rate and rhythm, no murmurs or gallops Breast:Examined in sitting and supine position were symmetrical in appearance, no palpable masses or tenderness,  no skin retraction, no nipple inversion, no nipple discharge, no skin discoloration, no axillary or supraclavicular lymphadenopathy Abdomen: no palpable masses or tenderness, no  rebound or guarding Extremities: no edema or skin discoloration or tenderness  Pelvic:  Bartholin, Urethra, Skene Glands: Within normal limits             Vagina: No gross lesions or discharge  Cervix: No gross lesions or discharge  Uterus  anteverted, normal size, shape and consistency, non-tender and mobile  Adnexa  Without masses or tenderness  Anus and perineum  normal    Rectovaginal  normal sphincter tone without palpated masses or tenderness             Hemoccult card provided for the patient to submit to the office     Assessment/Plan:  62 y.o. female for annual exam who will be scheduling her bone density study in the next few weeks. Hemoccult card presented to the patient to submit to the office for testing. We went over the recommended requirements her calcium and vitamin D. Recent calcium and vitamin D levels were in normal range. No Pap smear done today, the new screening guidelines discussed. Patient will continue to followup with her primary physician for her multinodular goiter. We discussed importance of regular exercise. Patient is to make an appointment to see me after her next bone density study to discuss options of treatment.    Ok Edwards MD, 5:05 PM 06/07/2012

## 2012-06-07 NOTE — Patient Instructions (Addendum)
Tetanus, Diphtheria, Pertussis (Tdap) Vaccine What You Need to Know WHY GET VACCINATED? Tetanus, diphtheria and pertussis can be very serious diseases, even for adolescents and adults. Tdap vaccine can protect us from these diseases. TETANUS (Lockjaw) causes painful muscle tightening and stiffness, usually all over the body.  It can lead to tightening of muscles in the head and neck so you can't open your mouth, swallow, or sometimes even breathe. Tetanus kills about 1 out of 5 people who are infected. DIPHTHERIA can cause a thick coating to form in the back of the throat.  It can lead to breathing problems, paralysis, heart failure, and death. PERTUSSIS (Whooping Cough) causes severe coughing spells, which can cause difficulty breathing, vomiting and disturbed sleep.  It can also lead to weight loss, incontinence, and rib fractures. Up to 2 in 100 adolescents and 5 in 100 adults with pertussis are hospitalized or have complications, which could include pneumonia and death. These diseases are caused by bacteria. Diphtheria and pertussis are spread from person to person through coughing or sneezing. Tetanus enters the body through cuts, scratches, or wounds. Before vaccines, the [place] saw as many as 200,000 cases a year of diphtheria and pertussis, and hundreds of cases of tetanus. Since vaccination began, tetanus and diphtheria have dropped by about 99% and pertussis by about 80%. TDAP VACCINE Tdap vaccine can protect adolescents and adults from tetanus, diphtheria, and pertussis. One dose of Tdap is routinely given at age 11 or 12. People who did not get Tdap at that age should get it as soon as possible. Tdap is especially important for health care professionals and anyone having close contact with a baby younger than 12 months. Pregnant women should get a dose of Tdap during every pregnancy, to protect the newborn from pertussis. Infants are most at risk for severe, life-threatening  complications from pertussis. A similar vaccine, called Td, protects from tetanus and diphtheria, but not pertussis. A Td booster should be given every 10 years. Tdap may be given as one of these boosters if you have not already gotten a dose. Tdap may also be given after a severe cut or burn to prevent tetanus infection. Your doctor can give you more information. Tdap may safely be given at the same time as other vaccines. SOME PEOPLE SHOULD NOT GET THIS VACCINE  If you ever had a life-threatening allergic reaction after a dose of any tetanus, diphtheria, or pertussis containing vaccine, OR if you have a severe allergy to any part of this vaccine, you should not get Tdap. Tell your doctor if you have any severe allergies.  If you had a coma, or long or multiple seizures within 7 days after a childhood dose of DTP or DTaP, you should not get Tdap, unless a cause other than the vaccine was found. You can still get Td.  Talk to your doctor if you:  have epilepsy or another nervous system problem,  had severe pain or swelling after any vaccine containing diphtheria, tetanus or pertussis,  ever had Guillain-Barr Syndrome (GBS),  aren't feeling well on the day the shot is scheduled. RISKS OF A VACCINE REACTION With any medicine, including vaccines, there is a chance of side effects. These are usually mild and go away on their own, but serious reactions are also possible. Brief fainting spells can follow a vaccination, leading to injuries from falling. Sitting or lying down for about 15 minutes can help prevent these. Tell your doctor if you feel dizzy or light-headed, or   have vision changes or ringing in the ears. Mild problems following Tdap (Did not interfere with activities)  Pain where the shot was given (about 3 in 4 adolescents or 2 in 3 adults)  Redness or swelling where the shot was given (about 1 person in 5)  Mild fever of at least 100.11F (up to about 1 in 25 adolescents or 1 in  100 adults)  Headache (about 3 or 4 people in 10)  Tiredness (about 1 person in 3 or 4)  Nausea, vomiting, diarrhea, stomach ache (up to 1 in 4 adolescents or 1 in 10 adults)  Chills, body aches, sore joints, rash, swollen glands (uncommon) Moderate problems following Tdap (Interfered with activities, but did not require medical attention)  Pain where the shot was given (about 1 in 5 adolescents or 1 in 100 adults)  Redness or swelling where the shot was given (up to about 1 in 16 adolescents or 1 in 25 adults)  Fever over 102F (about 1 in 100 adolescents or 1 in 250 adults)  Headache (about 3 in 20 adolescents or 1 in 10 adults)  Nausea, vomiting, diarrhea, stomach ache (up to 1 or 3 people in 100)  Swelling of the entire arm where the shot was given (up to about 3 in 100). Severe problems following Tdap (Unable to perform usual activities, required medical attention)  Swelling, severe pain, bleeding and redness in the arm where the shot was given (rare). A severe allergic reaction could occur after any vaccine (estimated less than 1 in a million doses). WHAT IF THERE IS A SERIOUS REACTION? What should I look for?  Look for anything that concerns you, such as signs of a severe allergic reaction, very high fever, or behavior changes. Signs of a severe allergic reaction can include hives, swelling of the face and throat, difficulty breathing, a fast heartbeat, dizziness, and weakness. These would start a few minutes to a few hours after the vaccination. What should I do?  If you think it is a severe allergic reaction or other emergency that can't wait, call 9-1-1 or get the person to the nearest hospital. Otherwise, call your doctor.  Afterward, the reaction should be reported to the "Vaccine Adverse Event Reporting System" (VAERS). Your doctor might file this report, or you can do it yourself through the VAERS web site at www.vaers.LAgents.no, or by calling 1-(760)615-3628. VAERS is  only for reporting reactions. They do not give medical advice.  THE NATIONAL VACCINE INJURY COMPENSATION PROGRAM The National Vaccine Injury Compensation Program (VICP) is a federal program that was created to compensate people who may have been injured by certain vaccines. Persons who believe they may have been injured by a vaccine can learn about the program and about filing a claim by calling 1-(912)056-7268 or visiting the VICP website at SpiritualWord.at. HOW CAN I LEARN MORE?  Ask your doctor.  Call your local or state health department.  Contact the Centers for Disease Control and Prevention (CDC):  Call 563-331-5264 or visit CDC's website at PicCapture.uy CDC Tdap Vaccine VIS (10/08/11) Document Released: 11/17/2011 Document Reviewed: 11/17/2011 [facility] Patient Information 2013 [place], [place].  Osteoporosis Throughout your life, your body breaks down old bone and replaces it with new bone. As you get older, your body does not replace bone as quickly as it breaks it down. By the age of 30 years, most people begin to gradually lose bone because of the imbalance between bone loss and replacement. Some people lose more bone than others. Bone  loss beyond a specified normal degree is considered osteoporosis.  Osteoporosis affects the strength and durability of your bones. The inside of the ends of your bones and your flat bones, like the bones of your pelvis, look like honeycomb, filled with tiny open spaces. As bone loss occurs, your bones become less dense. This means that the open spaces inside your bones become bigger and the walls between these spaces become thinner. This makes your bones weaker. Bones of a person with osteoporosis can become so weak that they can break (fracture) during minor accidents, such as a simple fall. CAUSES  The following factors have been associated with the development of osteoporosis:  Smoking.  Drinking more than 2 alcoholic  drinks several days per week.  Long-term use of certain medicines:  Corticosteroids.  Chemotherapy medicines.  Thyroid medicines.  Antiepileptic medicines.  Gonadal hormone suppression medicine.  Immunosuppression medicine.  Being underweight.  Lack of physical activity.  Lack of exposure to the sun. This can lead to vitamin D deficiency.  Certain medical conditions:  Certain inflammatory bowel diseases, such as Crohn's disease and ulcerative colitis.  Diabetes.  Hyperthyroidism.  Hyperparathyroidism. RISK FACTORS Anyone can develop osteoporosis. However, the following factors can increase your risk of developing osteoporosis:  Gender Women are at higher risk than men.  Age Being older than 50 years increases your risk.  Ethnicity White and Asian people have an increased risk.  Weight Being extremely underweight can increase your risk of osteoporosis.  Family history of osteoporosis Having a family member who has developed osteoporosis can increase your risk. SYMPTOMS  Usually, people with osteoporosis have no symptoms.  DIAGNOSIS  Signs during a physical exam that may prompt your caregiver to suspect osteoporosis include:  Decreased height. This is usually caused by the compression of the bones that form your spine (vertebrae) because they have weakened and become fractured.  A curving or rounding of the upper back (kyphosis). To confirm signs of osteoporosis, your caregiver may request a procedure that uses 2 low-dose X-ray beams with different levels of energy to measure your bone mineral density (dual-energy X-ray absorptiometry [DXA]). Also, your caregiver may check your level of vitamin D. TREATMENT  The goal of osteoporosis treatment is to strengthen bones in order to decrease the risk of bone fractures. There are different types of medicines available to help achieve this goal. Some of these medicines work by slowing the processes of bone loss. Some  medicines work by increasing bone density. Treatment also involves making sure that your levels of calcium and vitamin D are adequate. PREVENTION  There are things you can do to help prevent osteoporosis. Adequate intake of calcium and vitamin D can help you achieve optimal bone mineral density. Regular exercise can also help, especially resistance and high-impact activities. If you smoke, quitting smoking is an important part of osteoporosis prevention. MAKE SURE YOU:  Understand these instructions.  Will watch your condition.  Will get help right away if you are not doing well or get worse. Document Released: 02/25/2005 Document Revised: 08/10/2011 Document Reviewed: 05/02/2011 [facility] Patient Information 2013 [place], [place].

## 2012-06-17 ENCOUNTER — Encounter: Payer: Self-pay | Admitting: Obstetrics and Gynecology

## 2012-06-20 ENCOUNTER — Encounter: Payer: Self-pay | Admitting: Gynecology

## 2012-09-16 ENCOUNTER — Ambulatory Visit: Payer: BC Managed Care – PPO

## 2012-09-16 ENCOUNTER — Ambulatory Visit (INDEPENDENT_AMBULATORY_CARE_PROVIDER_SITE_OTHER): Payer: BC Managed Care – PPO | Admitting: Family Medicine

## 2012-09-16 VITALS — BP 121/72 | HR 70 | Temp 98.2°F | Resp 16 | Ht 65.5 in | Wt 119.0 lb

## 2012-09-16 DIAGNOSIS — M79644 Pain in right finger(s): Secondary | ICD-10-CM

## 2012-09-16 DIAGNOSIS — S61209A Unspecified open wound of unspecified finger without damage to nail, initial encounter: Secondary | ICD-10-CM

## 2012-09-16 DIAGNOSIS — Z23 Encounter for immunization: Secondary | ICD-10-CM

## 2012-09-16 DIAGNOSIS — M79609 Pain in unspecified limb: Secondary | ICD-10-CM

## 2012-09-16 MED ORDER — CEPHALEXIN 500 MG PO CAPS: 500.0000 mg | ORAL_CAPSULE | Freq: Two times a day (BID) | ORAL | Status: DC

## 2012-09-16 NOTE — Progress Notes (Signed)
[facility] [address] [place] [address] [phone]  Date:  09/16/2012   Name:  Kelly Hahn   DOB:  1951/05/07   MRN:  [id number]  PCP:  Lucilla Edin, MD    Chief Complaint: Finger Injury   History of Present Illness:  Kelly Hahn is a 62 y.o. very pleasant female patient who presents with the following:  She was trying to fold down an umbrella yesterday-one of the umbrella spines punctured her right index finger over the medial DIP.  She has used ibuprofen and ice, but the area has become more swollen and painful since yesterday  She is right handed Generally healthy She is unsure of the date of her last tetanus shot but thinks it has been many years  Patient Active Problem List  Diagnosis  . Osteoporosis  . Endometriosis  . Abnormal liver function test  . PVC (premature ventricular contraction)  . Goiter  . Grief at loss of child  . Hyperlipidemia  . Snoring  . Heartburn  . Alopecia    Past Medical History  Diagnosis Date  . Osteoporosis   . Endometriosis   . Thyroid disease     Nodules  . Fracture of arm   . Palpitations   . Anemia   . Heart murmur     Past Surgical History  Procedure Laterality Date  . Tummy tuck    . Pelvic laparoscopy  1989    History  Substance Use Topics  . Smoking status: Never Smoker   . Smokeless tobacco: Never Used  . Alcohol Use: 1.8 oz/week    3 Glasses of wine per week     Comment: per week    Family History  Problem Relation Age of Onset  . Hypertension Mother   . Diabetes Mother   . Osteoarthritis Mother   . Heart disease Mother   . Heart disease Father   . Alzheimer's disease Father   . Cancer Maternal Aunt     colon  . Breast cancer Paternal Aunt   . Heart disease Maternal Grandfather     Allergies  Allergen Reactions  . Tramadol Other (See Comments)    "doesn't remember"     Medication list has been reviewed and updated.    Review of  Systems:  As per HPI- otherwise negative.   Physical Examination: Filed Vitals:   09/16/12 1813  BP: 121/72  Pulse: 70  Temp: 98.2 F (36.8 C)  Resp: 16   Filed Vitals:   09/16/12 1813  Height: 5' 5.5" (1.664 m)  Weight: 119 lb (53.978 kg)   Body mass index is 19.49 kg/(m^2). Ideal Body Weight: Weight in (lb) to have BMI = 25: 152.2  GEN: WDWN, NAD, Non-toxic, A & O x 3, slim build HEENT: Atraumatic, Normocephalic. Neck supple. No masses, No LAD. Ears and Nose: No external deformity. CV: RRR, No M/G/R. No JVD. No thrill. No extra heart sounds. PULM: CTA B, no wheezes, crackles, rhonchi. No retractions. No resp. distress. No accessory muscle use. EXTR: No c/c/e NEURO Normal gait.  PSYCH: Normally interactive. Conversant. Not depressed or anxious appearing.  Calm demeanor.   Right index finger: there is a small wound on the lateral, distal finger near the DIP joint. There is slight redness, swelling, and heat but no discharge.  She appears to have significant OA in her hands  [facility] reading (PRIMARY) by  Dr. Patsy Lager. Right index finger: no fracture, degenerative change  RIGHT  INDEX FINGER 2+V  Comparison: None.  Findings: Osteoarthritis of the IP joint is present. Dystrophic calcification at the distal extensor origin on the terminal phalanx. Soft tissue swelling present over the dorsum of the PIP joint. No radiopaque foreign body. Degenerative subchondral cysts in the base of the middle phalanx. Anatomic alignment. No fracture.  IMPRESSION: Osteoarthritis of the index finger. No radiopaque foreign body or acute osseous abnormality.  Wound dressed and placed in a fold- over splint.  Assessment and Plan: Wound, open, finger, initial encounter - Plan: Tdap vaccine greater than or equal to 7yo IM, DG Finger Index Right, cephALEXin (KEFLEX) 500 MG capsule  Pain in finger of right hand  Minor wound or right index finger that appears to be midly infected.  Will treat  with keflex and update tdap today.  Meds ordered this encounter  Medications  . cephALEXin (KEFLEX) 500 MG capsule    Sig: Take 1 capsule (500 mg total) by mouth 2 (two) times daily.    Dispense:  20 capsule    Refill:  0     Signed Abbe Amsterdam, MD

## 2012-09-16 NOTE — Patient Instructions (Addendum)
Use the antibiotic as directed and splint as needed.  You may continue ibuprofen as needed.  Let me know if your finger is not looking better in the next couple of days- Sooner if worse.

## 2012-09-22 ENCOUNTER — Ambulatory Visit (INDEPENDENT_AMBULATORY_CARE_PROVIDER_SITE_OTHER): Payer: BC Managed Care – PPO | Admitting: Emergency Medicine

## 2012-09-22 VITALS — BP 112/78 | HR 98 | Temp 100.3°F | Resp 16 | Ht 65.5 in | Wt 116.0 lb

## 2012-09-22 DIAGNOSIS — R059 Cough, unspecified: Secondary | ICD-10-CM

## 2012-09-22 DIAGNOSIS — R509 Fever, unspecified: Secondary | ICD-10-CM

## 2012-09-22 DIAGNOSIS — J029 Acute pharyngitis, unspecified: Secondary | ICD-10-CM

## 2012-09-22 DIAGNOSIS — R05 Cough: Secondary | ICD-10-CM

## 2012-09-22 LAB — POCT CBC
Granulocyte percent: 69.1 %G (ref 37–80)
HCT, POC: 44.2 % (ref 37.7–47.9)
Hemoglobin: 13.7 g/dL (ref 12.2–16.2)
Lymph, poc: 1 (ref 0.6–3.4)
MCH, POC: 26.8 pg — AB (ref 27–31.2)
MCHC: 31 g/dL — AB (ref 31.8–35.4)
MCV: 86.4 fL (ref 80–97)
MID (cbc): 0.6 (ref 0–0.9)
MPV: 9.6 fL (ref 0–99.8)
POC Granulocyte: 3.4 (ref 2–6.9)
POC LYMPH PERCENT: 19.4 %L (ref 10–50)
POC MID %: 11.5 %M (ref 0–12)
Platelet Count, POC: 264 10*3/uL (ref 142–424)
RBC: 5.12 M/uL (ref 4.04–5.48)
RDW, POC: 13.6 %
WBC: 4.9 10*3/uL (ref 4.6–10.2)

## 2012-09-22 LAB — POCT INFLUENZA A/B
Influenza A, POC: NEGATIVE
Influenza B, POC: NEGATIVE

## 2012-09-22 LAB — POCT RAPID STREP A (OFFICE): Rapid Strep A Screen: NEGATIVE

## 2012-09-22 MED ORDER — FLUTICASONE PROPIONATE 50 MCG/ACT NA SUSP: 2.0000 | Freq: Every day | NASAL | Status: DC

## 2012-09-22 MED ORDER — BENZONATATE 100 MG PO CAPS: 100.0000 mg | ORAL_CAPSULE | Freq: Three times a day (TID) | ORAL | Status: DC | PRN

## 2012-09-22 NOTE — Progress Notes (Signed)
  Subjective:    Patient ID: Kelly Hahn, female    DOB: 12-30-1950, 62 y.o.   MRN: [id number]  HPI 62 yo female complaining of headache, sinus pressure, nauseous, sore throat, cough which started yesterday. Tried dayquil and nyquil which has not helped. Still taking Keflex. Had flu shot, no known sick contacts other than visit here earlier this week.     Review of Systems  Constitutional: Positive for fever, chills and fatigue.  HENT: Positive for congestion and postnasal drip.   Respiratory: Positive for cough. Negative for chest tightness, shortness of breath and wheezing.   Gastrointestinal: Positive for nausea. Negative for vomiting and diarrhea.  Skin: Negative.   Neurological: Positive for headaches.       Objective:   Physical Exam  Constitutional: She is oriented to person, place, and time. She appears well-developed and well-nourished.  HENT:  Head: Normocephalic and atraumatic.  Right Ear: External ear normal.  Left Ear: External ear normal.  Red, swollen nasal turbinates. Mildly erythematous throat. Tenderness of maxillary sinuses.  Neck: Neck supple.  Cardiovascular: Normal rate, regular rhythm and normal heart sounds.   Pulmonary/Chest: Effort normal and breath sounds normal.  Abdominal: Soft. Bowel sounds are normal.  Lymphadenopathy:    She has cervical adenopathy.  Neurological: She is alert and oriented to person, place, and time.  Skin: Skin is warm and dry.    Results for orders placed in visit on 09/22/12  POCT CBC      Result Value Range   WBC 4.9  4.6 - 10.2 K/uL   Lymph, poc 1.0  0.6 - 3.4   POC LYMPH PERCENT 19.4  10 - 50 %L   MID (cbc) 0.6  0 - 0.9   POC MID % 11.5  0 - 12 %M   POC Granulocyte 3.4  2 - 6.9   Granulocyte percent 69.1  37 - 80 %G   RBC 5.12  4.04 - 5.48 M/uL   Hemoglobin 13.7  12.2 - 16.2 g/dL   HCT, POC 40.9  81.1 - 47.9 %   MCV 86.4  80 - 97 fL   MCH, POC 26.8 (*) 27 - 31.2 pg   MCHC 31.0 (*) 31.8 - 35.4 g/dL   RDW, POC 91.4     Platelet Count, POC 264  142 - 424 K/uL   MPV 9.6  0 - 99.8 fL  POCT INFLUENZA A/B      Result Value Range   Influenza A, POC Negative     Influenza B, POC Negative           Assessment & Plan:  Likely viral illness. Strep screen performed. Treat symptomatically. Ibuprofen to reduce fever. Tessalon Perles for cough. Flonase to reduce inflammation in sinuses. Saline nasal rinse or steam from hot shower to open up sinuses.

## 2012-09-22 NOTE — Patient Instructions (Signed)
You likely have a viral illness. We will treat your symptoms, and this will hopefully resolve quickly. Take ibuprofen for your fever. Take the tessalon perles for cough. Use the Flonase to reduce the inflammation in your sinuses. Try saline nasal rinse or steam from the shower twice a day.

## 2012-09-23 ENCOUNTER — Telehealth: Payer: Self-pay

## 2012-09-23 NOTE — Telephone Encounter (Signed)
Pt is calling because she needs to talk to Dr. Cleta Alberts. Call back number is [phone]

## 2012-09-23 NOTE — Telephone Encounter (Signed)
Advised her.  

## 2012-09-23 NOTE — Telephone Encounter (Signed)
Please call and thank Kelly Hahn about the offer. That is very kind of her. I actually will be out of town tonight and back at work for the weekend.

## 2012-09-23 NOTE — Telephone Encounter (Signed)
Called her, patient wants to invite you (and a guest) to an Engineer, structural. Call her back for information if you are interested, she can not go.

## 2012-09-24 ENCOUNTER — Telehealth: Payer: Self-pay

## 2012-09-24 NOTE — Telephone Encounter (Signed)
Spoke with patient and she states she still is coughing a lot, non-productive, insides just feels sore from coughing, and chest aches from cough. She is having a clear drainage. Tiny bit of PND. She wants to know if she can take one of the allergy medications or benadryl? And if you think she should get some mucinex? She hasn't ever had a problem with allergies but thinks if she can stop drainage then that will ease the cough some. Wants to know which meds are safe to take with current medications. Please advise

## 2012-09-24 NOTE — Telephone Encounter (Signed)
Please add Astelin spray. One to 2 sprays each nares twice a day. Please let her know the spray  burns and gives a bad taste for approximately 10 minutes after using and then should help with her sinus congestion . She can also take Zyrtec 10 mg one a day

## 2012-09-24 NOTE — Telephone Encounter (Signed)
Patient was recently seen by Dr. Cleta Alberts. States she needs a RX called in because of drainage and coughing. Uses [facility] on [address].  CB# is [phone].

## 2012-09-25 MED ORDER — AZELASTINE HCL 0.1 % NA SOLN: 2.0000 | Freq: Two times a day (BID) | NASAL | Status: DC

## 2012-09-25 NOTE — Telephone Encounter (Signed)
Sent in RX. Patient notified and voiced understanding.

## 2013-06-09 ENCOUNTER — Encounter: Payer: Self-pay | Admitting: Gynecology

## 2013-06-09 ENCOUNTER — Other Ambulatory Visit (HOSPITAL_COMMUNITY)
Admission: RE | Admit: 2013-06-09 | Discharge: 2013-06-09 | Disposition: A | Discharge status: Home / Self Care | Payer: BC Managed Care – PPO | Source: Ambulatory Visit | Attending: Gynecology | Admitting: Gynecology

## 2013-06-09 ENCOUNTER — Ambulatory Visit (INDEPENDENT_AMBULATORY_CARE_PROVIDER_SITE_OTHER): Payer: BC Managed Care – PPO | Admitting: Gynecology

## 2013-06-09 VITALS — BP 120/72 | Ht 64.5 in | Wt 120.8 lb

## 2013-06-09 DIAGNOSIS — M810 Age-related osteoporosis without current pathological fracture: Secondary | ICD-10-CM

## 2013-06-09 DIAGNOSIS — Z01419 Encounter for gynecological examination (general) (routine) without abnormal findings: Secondary | ICD-10-CM

## 2013-06-09 NOTE — Progress Notes (Signed)
Kelly Hahn [birth year] [id number]   History:    63 y.o.  for annual gyn exam with no complaints today. Patient with previous history of osteoporosis has refused medical treatment. Her last bone density study 2011 demonstrated her lowest T score was -2.8 at the [facility]. Patient had a history of a traumatic fracture of her arm in January 2013 and is currently taking calcium and vitamin D on a regular basis. The previous year she had lost 15 pounds and this year she has gained 3 pounds and currently weighs 120 pounds. She has been on a total vegetarian diet.Patient been followed by her primary physician Dr. Everlene Farrier for multinodular goiter. She's had normal thyroid function test recently and he has done all her lab work. Patient had a colonoscopy in 2012 and a benign polyp had been detected. Last bone density study was in 2011. Patient with no prior history of abnormal Pap smears. She has received her shingles vaccine but has declined the flu vaccine and believes she has had her Tdap vaccine. Patient has never been on hormone replacement therapy.   Past medical history,surgical history, family history and social history were all reviewed and documented in the EPIC chart.  Gynecologic History No LMP recorded. Patient is postmenopausal. Contraception: post menopausal status Last Pap: 2011. Results were: normal Last mammogram: 2014. Results were: normal  Obstetric History OB History  Gravida Para Term Preterm AB SAB TAB Ectopic Multiple Living  1 1 1        0    # Outcome Date GA Lbr Len/2nd Weight Sex Delivery Anes PTL Lv  1 TRM                ROS: A ROS was performed and pertinent positives and negatives are included in the history.  GENERAL: No fevers or chills. HEENT: No change in vision, no earache, sore throat or sinus congestion. NECK: No pain or stiffness. CARDIOVASCULAR: No chest pain or pressure. No palpitations. PULMONARY: No shortness of breath, cough or wheeze.  GASTROINTESTINAL: No abdominal pain, nausea, vomiting or diarrhea, melena or bright red blood per rectum. GENITOURINARY: No urinary frequency, urgency, hesitancy or dysuria. MUSCULOSKELETAL: No joint or muscle pain, no back pain, no recent trauma. DERMATOLOGIC: No rash, no itching, no lesions. ENDOCRINE: No polyuria, polydipsia, no heat or cold intolerance. No recent change in weight. HEMATOLOGICAL: No anemia or easy bruising or bleeding. NEUROLOGIC: No headache, seizures, numbness, tingling or weakness. PSYCHIATRIC: No depression, no loss of interest in normal activity or change in sleep pattern.     Exam: chaperone present  BP 120/72  Ht 5' 4.5" (1.638 m)  Wt 120 lb 12.8 oz (54.795 kg)  BMI 20.42 kg/m2  Body mass index is 20.42 kg/(m^2).  General appearance : Well developed well nourished female. No acute distress HEENT: Neck supple, trachea midline, no carotid bruits, no thyroidmegaly Lungs: Clear to auscultation, no rhonchi or wheezes, or rib retractions  Heart: Regular rate and rhythm, no murmurs or gallops Breast:Examined in sitting and supine position were symmetrical in appearance, no palpable masses or tenderness,  no skin retraction, no nipple inversion, no nipple discharge, no skin discoloration, no axillary or supraclavicular lymphadenopathy Abdomen: no palpable masses or tenderness, no rebound or guarding Extremities: no edema or skin discoloration or tenderness  Pelvic:  Bartholin, Urethra, Skene Glands: Within normal limits             Vagina: No gross lesions or discharge, atrophic changes  Cervix: No gross lesions  or discharge  Uterus  anteverted, normal size, shape and consistency, non-tender and mobile  Adnexa  Without masses or tenderness  Anus and perineum  normal   Rectovaginal  normal sphincter tone without palpated masses or tenderness             Hemoccult PCP provides     Assessment/Plan:  63 y.o. female for annual exam with history of osteoporosis. We went  through a detailed discussion of the different types of treatment for osteoporosis as well as risk benefits pros and cons. Literature information was provided on Prolia, Reclast and Forteo since she states that she cannot take oral bisphosphonate. She will schedule her bone density study in the next [redacted] weeks along with her mammogram and she will return back to the office to discuss recommended treatment for her osteoporosis. I have explained to her that I believe that she is taking too much vitamin D (25,000 units weekly) I would recommend vitamin D 3 2000 units daily. PCP drawn her blood work. Pap smear was done today. The new guidelines were discussed.  Note: This dictation was prepared with  Dragon/digital dictation along withSmart phrase technology. Any transcriptional errors that result from this process are unintentional.   Terrance Mass MD, 12:31 PM 06/09/2013

## 2013-06-09 NOTE — Patient Instructions (Signed)
Osteoporosis Throughout your life, your body breaks down old bone and replaces it with new bone. As you get older, your body does not replace bone as quickly as it breaks it down. By the age of 30 years, most people begin to gradually lose bone because of the imbalance between bone loss and replacement. Some people lose more bone than others. Bone loss beyond a specified normal degree is considered osteoporosis.  Osteoporosis affects the strength and durability of your bones. The inside of the ends of your bones and your flat bones, like the bones of your pelvis, look like honeycomb, filled with tiny open spaces. As bone loss occurs, your bones become less dense. This means that the open spaces inside your bones become bigger and the walls between these spaces become thinner. This makes your bones weaker. Bones of a person with osteoporosis can become so weak that they can break (fracture) during minor accidents, such as a simple fall. CAUSES  The following factors have been associated with the development of osteoporosis:  Smoking.  Drinking more than 2 alcoholic drinks several days per week.  Long-term use of certain medicines:  Corticosteroids.  Chemotherapy medicines.  Thyroid medicines.  Antiepileptic medicines.  Gonadal hormone suppression medicine.  Immunosuppression medicine.  Being underweight.  Lack of physical activity.  Lack of exposure to the sun. This can lead to vitamin D deficiency.  Certain medical conditions:  Certain inflammatory bowel diseases, such as Crohn disease and ulcerative colitis.  Diabetes.  Hyperthyroidism.  Hyperparathyroidism. RISK FACTORS Anyone can develop osteoporosis. However, the following factors can increase your risk of developing osteoporosis:  Gender Women are at higher risk than men.  Age Being older than 50 years increases your risk.  Ethnicity White and Asian people have an increased risk.  Weight Being extremely  underweight can increase your risk of osteoporosis.  Family history of osteoporosis Having a family member who has developed osteoporosis can increase your risk. SYMPTOMS  Usually, people with osteoporosis have no symptoms.  DIAGNOSIS  Signs during a physical exam that may prompt your caregiver to suspect osteoporosis include:  Decreased height. This is usually caused by the compression of the bones that form your spine (vertebrae) because they have weakened and become fractured.  A curving or rounding of the upper back (kyphosis). To confirm signs of osteoporosis, your caregiver may request a procedure that uses 2 low-dose X-ray beams with different levels of energy to measure your bone mineral density (dual-energy X-ray absorptiometry [DXA]). Also, your caregiver may check your level of vitamin D. TREATMENT  The goal of osteoporosis treatment is to strengthen bones in order to decrease the risk of bone fractures. There are different types of medicines available to help achieve this goal. Some of these medicines work by slowing the processes of bone loss. Some medicines work by increasing bone density. Treatment also involves making sure that your levels of calcium and vitamin D are adequate. PREVENTION  There are things you can do to help prevent osteoporosis. Adequate intake of calcium and vitamin D can help you achieve optimal bone mineral density. Regular exercise can also help, especially resistance and weight-bearing activities. If you smoke, quitting smoking is an important part of osteoporosis prevention. MAKE SURE YOU:  Understand these instructions.  Will watch your condition.  Will get help right away if you are not doing well or get worse. FOR MORE INFORMATION www.osteo.org and www.nof.org Document Released: 02/25/2005 Document Revised: 09/12/2012 Document Reviewed: 05/02/2011 ExitCare Patient Information 2014 [employer].  

## 2013-06-12 ENCOUNTER — Telehealth: Payer: Self-pay | Admitting: *Deleted

## 2013-06-12 NOTE — Telephone Encounter (Signed)
Pt called requesting order faxed to [facility], this was done.

## 2013-06-20 ENCOUNTER — Telehealth: Payer: Self-pay | Admitting: *Deleted

## 2013-06-20 NOTE — Telephone Encounter (Signed)
Pt called requesting stool specimen kit mail to home address. This was done.

## 2013-07-14 ENCOUNTER — Encounter: Payer: Self-pay | Admitting: Gynecology

## 2013-07-24 ENCOUNTER — Encounter: Payer: Self-pay | Admitting: Gynecology

## 2013-08-17 ENCOUNTER — Ambulatory Visit: Payer: BC Managed Care – PPO | Admitting: Gynecology

## 2014-01-09 ENCOUNTER — Telehealth: Payer: Self-pay | Admitting: *Deleted

## 2014-01-09 NOTE — Telephone Encounter (Signed)
Pt called requesting stool specimen result, pt informed result not back yet

## 2014-01-11 DIAGNOSIS — Z1211 Encounter for screening for malignant neoplasm of colon: Secondary | ICD-10-CM

## 2014-01-12 ENCOUNTER — Other Ambulatory Visit: Payer: BC Managed Care – PPO | Admitting: Anesthesiology

## 2014-01-12 DIAGNOSIS — Z1211 Encounter for screening for malignant neoplasm of colon: Secondary | ICD-10-CM

## 2014-02-06 ENCOUNTER — Ambulatory Visit: Payer: BC Managed Care – PPO | Admitting: Emergency Medicine

## 2014-02-20 ENCOUNTER — Ambulatory Visit: Payer: BC Managed Care – PPO | Admitting: Emergency Medicine

## 2014-03-15 ENCOUNTER — Telehealth: Payer: Self-pay | Admitting: *Deleted

## 2014-03-15 NOTE — Telephone Encounter (Signed)
Pt informed with negative stool specimen results from August.

## 2014-04-02 ENCOUNTER — Encounter: Payer: Self-pay | Admitting: Gynecology

## 2014-06-12 ENCOUNTER — Telehealth: Payer: Self-pay

## 2014-06-12 DIAGNOSIS — L659 Nonscarring hair loss, unspecified: Secondary | ICD-10-CM

## 2014-06-12 NOTE — Telephone Encounter (Signed)
Patient is requesting a referral from Dr. Everlene Farrier. She is scheduled to see him on 07/17/14 but is wanting to get her thyroid checked because she is experiencing severe hair loss. She suspects it is this and wanted to be proactive and see an endocrinologist (she said we referred her to [facility] before- I don't see any referral or documentation- self referral maybe?) . Before her appointment with Dr. Everlene Farrier. Please advise. If she needs to RTC or if this can be placed for her.   Best: [phone]

## 2014-06-12 NOTE — Telephone Encounter (Signed)
Go ahead and place referral to [facility] endocrinology to evaluate hair loss rule out secondary to thyroid disease

## 2014-06-12 NOTE — Telephone Encounter (Signed)
Please advise if referral needs to be entered for endocrinology or if pt should wait until OV.

## 2014-06-14 NOTE — Telephone Encounter (Signed)
Order has been placed.

## 2014-06-19 ENCOUNTER — Ambulatory Visit (INDEPENDENT_AMBULATORY_CARE_PROVIDER_SITE_OTHER): Payer: BLUE CROSS/BLUE SHIELD | Admitting: Podiatrist

## 2014-06-19 ENCOUNTER — Encounter: Payer: Self-pay | Admitting: Podiatrist

## 2014-06-19 VITALS — BP 109/67 | HR 63 | Resp 10 | Ht 65.0 in | Wt 121.0 lb

## 2014-06-19 DIAGNOSIS — B351 Tinea unguium: Secondary | ICD-10-CM

## 2014-06-19 NOTE — Progress Notes (Signed)
   Subjective:    Patient ID: Kelly Hahn, female    DOB: 1951/01/06, 64 y.o.   MRN: [id number]  HPI  Patient presents today for a darkened and discolored left hallux nail.  She relates it came on slowly and is not painful.  The nail can be lifted from the distal tip and is loose at the very distal tip.      Review of Systems  All other systems reviewed and are negative.      Objective:   Physical Exam  Patient is awake, alert, and oriented x 3.  In no acute distress.  Vascular status is intact with palpable pedal pulses at 2/4 DP and PT bilateral and capillary refill time within normal limits. Neurological sensation is also intact bilaterally via Semmes Weinstein monofilament at 5/5 sites. Light touch, vibratory sensation, Achilles tendon reflex is intact. Dermatological exam reveals blackening/ darkening discoloration of the hallux nail itself.  The underlying skin of the toenail is not disclored.  the darkening of the toenail is throughout the entire nail surface and does not extend to the most proximal attachment of the nail.  Otherwise her skin color, turger and texture are as normal. No open lesions present.  Musculature intact with dorsiflexion, plantarflexion, inversion, eversion.     Assessment & Plan:  Fungus versus pseuomonas infection in the toenail left hallux  Plan:  Recommended taking a culture of the toenail to determine the pathogen for the darkening and discoloration.  Will consider oral medication versus topical depending on the culture result.  Will notify the patient with the culture result when it becomes available.  If any questions or concerns arise-- she will call.

## 2014-06-19 NOTE — Patient Instructions (Signed)
Onychomycosis/Fungal Toenails  WHAT IS IT? An infection that lies within the keratin of your nail plate that is caused by a fungus.  WHY ME? Fungal infections affect all ages, sexes, races, and creeds.  There may be many factors that predispose you to a fungal infection such as age, coexisting medical conditions such as diabetes, or an autoimmune disease; stress, medications, fatigue, genetics, etc.  Bottom line: fungus thrives in a warm, moist environment and your shoes offer such a location.  IS IT CONTAGIOUS? Theoretically, yes.  You do not want to share shoes, nail clippers or files with someone who has fungal toenails.  Walking around barefoot in the same room or sleeping in the same bed is unlikely to transfer the organism.  It is important to realize, however, that fungus can spread easily from one nail to the next on the same foot.  HOW DO WE TREAT THIS?  There are several ways to treat this condition.  Treatment may depend on many factors such as age, medications, pregnancy, liver and kidney conditions, etc.  It is best to ask your doctor which options are available to you.  1. No treatment.   Unlike many other medical concerns, you can live with this condition.  However for many people this can be a painful condition and may lead to ingrown toenails or a bacterial infection.  It is recommended that you keep the nails cut short to help reduce the amount of fungal nail. 2. Topical treatment. - there are new topicals on the market that have had good outcomes with fungal nails. Usually the infection must be mild to only need a topical medication.  The topical is applied daily to an un painted nail.   3.  Oral antifungal medications.  With an 80-90% cure rate, the most common oral medication requires 3 to 4 months of therapy and stays in your system for a year as the new nail grows out.  Oral antifungal medications do require blood work to make sure it is a safe drug for you.  A liver function panel  will be performed prior to starting the medication and after the first month of treatment.  It is important to have the blood work performed to avoid any harmful side effects. This medication can have harmful side effects and isn't recommended for all patients, especially those on multiple medications. 4. Laser therapy-  A good option for a very new or minor toenail infection.  Usually used in combination with a topical.  [facility] is the only practice to offer the laser in the area and we have treatment plans depending on the number of toenails effected.  This is an out of pocket service as insurance will not pay for the procedure as they deem it cosmetic.   5. Permanent Nail Avulsion.  Removing the entire nail so that a new nail will not grow back.

## 2014-06-26 ENCOUNTER — Telehealth: Payer: Self-pay

## 2014-06-26 NOTE — Telephone Encounter (Signed)
Spoke with pt. Advised waiting to see Dr. Everlene Farrier at her next appt and discussing these sxs she is having so that we can refer her. Pt agreeable to this plan

## 2014-06-26 NOTE — Telephone Encounter (Signed)
[facility] cannot see a patient with the dx of hair loss. They can see her for thyroid disease, but there are no supporting office notes as the patient has not been seen since 08/2012. Please advise

## 2014-07-16 ENCOUNTER — Encounter: Payer: Self-pay | Admitting: Podiatrist

## 2014-07-16 ENCOUNTER — Telehealth: Payer: Self-pay | Admitting: *Deleted

## 2014-07-16 NOTE — Telephone Encounter (Signed)
I called and informed the patient that Dr. Valentina Lucks said there is no fungus present.  The nail probably has had micro trauma.  "You mean there is no fungus?  That is not good news."  She said she can remove the nail for you if you like and let it grown back in 10 months or so,  or you can wait and let it grow out, hopefully it will reattach itself.  "Well it has been like this for 5 months."  I informed her that sometime the culture can have a false negative.  You can come back in about 3 months and have another sample sent out.  "Let's do that.  I'd like to go ahead and schedule it now because her schedule gets full."  I transferred her to a scheduler.

## 2014-07-16 NOTE — Telephone Encounter (Signed)
-----   Message from Bronson Ing, DPM sent at 07/10/2014  8:54 PM EST ----- Regarding: culture result Could you call mrs. Tregre and let her know that the toenail sample was negative for fungus and more likely represents a micro trauma to the toenail pathologically.  If she would like to treat the nail, I would recommend removing the nail in a non permanent fashion and allowing a new nail to grow out to take its place (over the next 10 or so months) .  Or she can just wait and watch the nail and not get any treatments at a nail salon in the [place] to see if it will grow out and reattach on its own.    Thanks!  E

## 2014-07-17 ENCOUNTER — Encounter: Payer: Self-pay | Admitting: Emergency Medicine

## 2014-07-23 ENCOUNTER — Telehealth: Payer: Self-pay

## 2014-07-23 DIAGNOSIS — Z0000 Encounter for general adult medical examination without abnormal findings: Secondary | ICD-10-CM

## 2014-07-23 NOTE — Telephone Encounter (Signed)
List the labs and will order them for you.

## 2014-07-23 NOTE — Telephone Encounter (Signed)
Patient is requesting Dr. Everlene Farrier put in orders for her lab work to be drawn b/4 her CPE (same day).

## 2014-07-24 NOTE — Telephone Encounter (Signed)
Please order a CBC C met lipid panel TSH free T4 and vitamin D level.

## 2014-07-24 NOTE — Telephone Encounter (Signed)
Future orders placed. Left a detailed message on pt's voicemail letting her know.

## 2014-07-25 ENCOUNTER — Ambulatory Visit (INDEPENDENT_AMBULATORY_CARE_PROVIDER_SITE_OTHER): Payer: BLUE CROSS/BLUE SHIELD | Admitting: Podiatrist

## 2014-07-25 ENCOUNTER — Encounter: Payer: Self-pay | Admitting: Podiatrist

## 2014-07-25 ENCOUNTER — Encounter: Payer: Self-pay | Admitting: Gynecology

## 2014-07-25 DIAGNOSIS — L603 Nail dystrophy: Secondary | ICD-10-CM

## 2014-07-25 NOTE — Progress Notes (Signed)
   Subjective:    Patient ID: Kelly Hahn, female    DOB: 21-Jun-1950, 64 y.o.   MRN: [id number]  HPI  PT STATED LT FOOT TOP OF THE GREAT TOE HAVE A DARK SPOT. THE SPOT IS A LITTLE BIGGER BUT DOES NOT HURT.  Review of Systems  All other systems reviewed and are negative.      Objective:   Physical Exam Neurovascular status is intact and unchanged. A purplish/greenish discoloration is present in the central portion of the left hallux toenail the right hallux toenail also has a darkish area of discoloration in the central distal aspect of the toenail. The proximal portion of bilateral hallux nails is clear. The pathology report showed no sign of fungal infection no sign of melanin was reported and microtrauma was identified.  He has a tiny purplish discoloration on the skin where the toenail appears to have dug in prior to it being cut which appears to be a small bruise.    Assessment & Plan:   Microtrauma bilateral great toenails with discoloration present  Plan: Reassured the patient that there was no sign of melanin pigment in the culture. It does not look to be a skin cancer it does look like a microtrauma which will grow out. She was instructed to watch the toenails for growth and if she sees any streaking in the nail plate that extends to the proximal nail fold she will call immediately. We can always do biopsy however this is going to make a permanent disfigurement in the toenail.  Will watch closely and notify me of any signs of changes.

## 2014-09-11 ENCOUNTER — Encounter: Payer: BLUE CROSS/BLUE SHIELD | Admitting: Emergency Medicine

## 2014-09-19 ENCOUNTER — Ambulatory Visit: Payer: BLUE CROSS/BLUE SHIELD | Admitting: Podiatrist

## 2014-11-03 ENCOUNTER — Encounter (HOSPITAL_BASED_OUTPATIENT_CLINIC_OR_DEPARTMENT_OTHER): Payer: Self-pay | Admitting: *Deleted

## 2014-11-03 ENCOUNTER — Emergency Department (HOSPITAL_BASED_OUTPATIENT_CLINIC_OR_DEPARTMENT_OTHER)
Admission: EM | Admit: 2014-11-03 | Discharge: 2014-11-03 | Disposition: A | Discharge status: Home / Self Care | Payer: BLUE CROSS/BLUE SHIELD | Attending: Emergency Medicine | Admitting: Emergency Medicine

## 2014-11-03 DIAGNOSIS — M791 Myalgia: Secondary | ICD-10-CM | POA: Insufficient documentation

## 2014-11-03 DIAGNOSIS — D649 Anemia, unspecified: Secondary | ICD-10-CM | POA: Diagnosis not present

## 2014-11-03 DIAGNOSIS — M810 Age-related osteoporosis without current pathological fracture: Secondary | ICD-10-CM | POA: Diagnosis not present

## 2014-11-03 DIAGNOSIS — R05 Cough: Secondary | ICD-10-CM | POA: Diagnosis present

## 2014-11-03 DIAGNOSIS — Z79899 Other long term (current) drug therapy: Secondary | ICD-10-CM | POA: Insufficient documentation

## 2014-11-03 DIAGNOSIS — Z8639 Personal history of other endocrine, nutritional and metabolic disease: Secondary | ICD-10-CM | POA: Diagnosis not present

## 2014-11-03 DIAGNOSIS — Z7982 Long term (current) use of aspirin: Secondary | ICD-10-CM | POA: Insufficient documentation

## 2014-11-03 DIAGNOSIS — R011 Cardiac murmur, unspecified: Secondary | ICD-10-CM | POA: Insufficient documentation

## 2014-11-03 DIAGNOSIS — J069 Acute upper respiratory infection, unspecified: Secondary | ICD-10-CM | POA: Diagnosis not present

## 2014-11-03 DIAGNOSIS — Z8781 Personal history of (healed) traumatic fracture: Secondary | ICD-10-CM | POA: Insufficient documentation

## 2014-11-03 DIAGNOSIS — Z8742 Personal history of other diseases of the female genital tract: Secondary | ICD-10-CM | POA: Insufficient documentation

## 2014-11-03 LAB — BASIC METABOLIC PANEL
Anion gap: 9 (ref 5–15)
BUN: 12 mg/dL (ref 6–20)
CO2: 26 mmol/L (ref 22–32)
Calcium: 8.8 mg/dL — ABNORMAL LOW (ref 8.9–10.3)
Chloride: 102 mmol/L (ref 101–111)
Creatinine, Ser: 0.68 mg/dL (ref 0.44–1.00)
GFR calc Af Amer: >60 mL/min (ref >60)
GFR calc non Af Amer: >60 mL/min (ref >60)
Glucose, Bld: 118 mg/dL — ABNORMAL HIGH (ref 65–99)
Potassium: 3.6 mmol/L (ref 3.5–5.1)
Sodium: 137 mmol/L (ref 135–145)

## 2014-11-03 LAB — CBC WITH DIFFERENTIAL/PLATELET
Basophils Absolute: 0 10*3/uL (ref 0.0–0.1)
Basophils Relative: 0 % (ref 0–1)
Eosinophils Absolute: 0 10*3/uL (ref 0.0–0.7)
Eosinophils Relative: 0 % (ref 0–5)
HCT: 39.8 % (ref 36.0–46.0)
Hemoglobin: 12.9 g/dL (ref 12.0–15.0)
Lymphocytes Relative: 7 % — ABNORMAL LOW (ref 12–46)
Lymphs Abs: 0.5 10*3/uL — ABNORMAL LOW (ref 0.7–4.0)
MCH: 27.3 pg (ref 26.0–34.0)
MCHC: 32.4 g/dL (ref 30.0–36.0)
MCV: 84.1 fL (ref 78.0–100.0)
Monocytes Absolute: 1 10*3/uL (ref 0.1–1.0)
Monocytes Relative: 15 % — ABNORMAL HIGH (ref 3–12)
Neutro Abs: 5.1 10*3/uL (ref 1.7–7.7)
Neutrophils Relative %: 78 % — ABNORMAL HIGH (ref 43–77)
Platelets: 230 10*3/uL (ref 150–400)
RBC: 4.73 MIL/uL (ref 3.87–5.11)
RDW: 13.7 % (ref 11.5–15.5)
WBC: 6.6 10*3/uL (ref 4.0–10.5)

## 2014-11-03 LAB — RAPID STREP SCREEN (MED CTR MEBANE ONLY): Streptococcus, Group A Screen (Direct): NEGATIVE

## 2014-11-03 MED ORDER — KETOROLAC TROMETHAMINE 30 MG/ML IJ SOLN
15.0000 mg | Freq: Once | INTRAMUSCULAR | Status: AC
  Administered 2014-11-03: 15 mg via INTRAVENOUS
  Filled 2014-11-03: qty 1

## 2014-11-03 MED ORDER — DEXAMETHASONE SODIUM PHOSPHATE 10 MG/ML IJ SOLN
10.0000 mg | Freq: Once | INTRAMUSCULAR | Status: AC
  Administered 2014-11-03: 10 mg via INTRAVENOUS
  Filled 2014-11-03: qty 1

## 2014-11-03 MED ORDER — ONDANSETRON HCL 4 MG PO TABS: 4.0000 mg | ORAL_TABLET | Freq: Four times a day (QID) | ORAL | Status: DC

## 2014-11-03 MED ORDER — ACETAMINOPHEN 325 MG PO TABS
ORAL_TABLET | ORAL | Status: AC
  Filled 2014-11-03: qty 2

## 2014-11-03 MED ORDER — ACETAMINOPHEN 325 MG PO TABS
650.0000 mg | ORAL_TABLET | Freq: Once | ORAL | Status: AC
  Administered 2014-11-03: 650 mg via ORAL

## 2014-11-03 MED ORDER — SODIUM CHLORIDE 0.9 % IV BOLUS (SEPSIS)
500.0000 mL | Freq: Once | INTRAVENOUS | Status: AC
  Administered 2014-11-03: 500 mL via INTRAVENOUS

## 2014-11-03 MED ORDER — ONDANSETRON HCL 4 MG/2ML IJ SOLN
4.0000 mg | Freq: Once | INTRAMUSCULAR | Status: AC
  Administered 2014-11-03: 4 mg via INTRAVENOUS
  Filled 2014-11-03: qty 2

## 2014-11-03 NOTE — Discharge Instructions (Signed)
Upper Respiratory Infection, Adult An upper respiratory infection (URI) is also sometimes known as the common cold. The upper respiratory tract includes the nose, sinuses, throat, trachea, and bronchi. Bronchi are the airways leading to the lungs. Most people improve within 1 week, but symptoms can last up to 2 weeks. A residual cough may last even longer.  CAUSES Many different viruses can infect the tissues lining the upper respiratory tract. The tissues become irritated and inflamed and often become very moist. Mucus production is also common. A cold is contagious. You can easily spread the virus to others by oral contact. This includes kissing, sharing a glass, coughing, or sneezing. Touching your mouth or nose and then touching a surface, which is then touched by another person, can also spread the virus. SYMPTOMS  Symptoms typically develop 1 to 3 days after you come in contact with a cold virus. Symptoms vary from person to person. They may include:  Runny nose.  Sneezing.  Nasal congestion.  Sinus irritation.  Sore throat.  Loss of voice (laryngitis).  Cough.  Fatigue.  Muscle aches.  Loss of appetite.  Headache.  Low-grade fever. DIAGNOSIS  You might diagnose your own cold based on familiar symptoms, since most people get a cold 2 to 3 times a year. Your caregiver can confirm this based on your exam. Most importantly, your caregiver can check that your symptoms are not due to another disease such as strep throat, sinusitis, pneumonia, asthma, or epiglottitis. Blood tests, throat tests, and X-rays are not necessary to diagnose a common cold, but they may sometimes be helpful in excluding other more serious diseases. Your caregiver will decide if any further tests are required. RISKS AND COMPLICATIONS  You may be at risk for a more severe case of the common cold if you smoke cigarettes, have chronic heart disease (such as heart failure) or lung disease (such as asthma), or if  you have a weakened immune system. The very young and very old are also at risk for more serious infections. Bacterial sinusitis, middle ear infections, and bacterial pneumonia can complicate the common cold. The common cold can worsen asthma and chronic obstructive pulmonary disease (COPD). Sometimes, these complications can require emergency medical care and may be life-threatening. PREVENTION  The best way to protect against getting a cold is to practice good hygiene. Avoid oral or hand contact with people with cold symptoms. Wash your hands often if contact occurs. There is no clear evidence that vitamin C, vitamin E, echinacea, or exercise reduces the chance of developing a cold. However, it is always recommended to get plenty of rest and practice good nutrition. TREATMENT  Treatment is directed at relieving symptoms. There is no cure. Antibiotics are not effective, because the infection is caused by a virus, not by bacteria. Treatment may include:  Increased fluid intake. Sports drinks offer valuable electrolytes, sugars, and fluids.  Breathing heated mist or steam (vaporizer or shower).  Eating chicken soup or other clear broths, and maintaining good nutrition.  Getting plenty of rest.  Using gargles or lozenges for comfort.  Controlling fevers with ibuprofen or acetaminophen as directed by your caregiver.  Increasing usage of your inhaler if you have asthma. Zinc gel and zinc lozenges, taken in the first 24 hours of the common cold, can shorten the duration and lessen the severity of symptoms. Pain medicines may help with fever, muscle aches, and throat pain. A variety of non-prescription medicines are available to treat congestion and runny nose. Your caregiver   can make recommendations and may suggest nasal or lung inhalers for other symptoms.  HOME CARE INSTRUCTIONS   Only take over-the-counter or prescription medicines for pain, discomfort, or fever as directed by your  caregiver.  Use a warm mist humidifier or inhale steam from a shower to increase air moisture. This may keep secretions moist and make it easier to breathe.  Drink enough water and fluids to keep your urine clear or pale yellow.  Rest as needed.  Return to work when your temperature has returned to normal or as your caregiver advises. You may need to stay home longer to avoid infecting others. You can also use a face mask and careful hand washing to prevent spread of the virus. SEEK MEDICAL CARE IF:   After the first few days, you feel you are getting worse rather than better.  You need your caregiver's advice about medicines to control symptoms.  You develop chills, worsening shortness of breath, or brown or red sputum. These may be signs of pneumonia.  You develop yellow or brown nasal discharge or pain in the face, especially when you bend forward. These may be signs of sinusitis.  You develop a fever, swollen neck glands, pain with swallowing, or white areas in the back of your throat. These may be signs of strep throat. SEEK IMMEDIATE MEDICAL CARE IF:   You have a fever.  You develop severe or persistent headache, ear pain, sinus pain, or chest pain.  You develop wheezing, a prolonged cough, cough up blood, or have a change in your usual mucus (if you have chronic lung disease).  You develop sore muscles or a stiff neck. Document Released: 11/11/2000 Document Revised: 08/10/2011 Document Reviewed: 08/23/2013 ExitCare Patient Information 2015 [employer]. This information is not intended to replace advice given to you by your health care provider. Make sure you discuss any questions you have with your health care provider.  

## 2014-11-03 NOTE — ED Notes (Signed)
Patient c/o sore throat, cough, nasal drainage, fever, body aches

## 2014-11-03 NOTE — ED Provider Notes (Signed)
CSN: [id number]     Arrival date & time 11/03/14  0720 History   First MD Initiated Contact with Patient 11/03/14 [phone]     Chief Complaint  Patient presents with  . URI     (Consider location/radiation/quality/duration/timing/severity/associated sxs/prior Treatment) HPI Comments: Presents to the emergency department for evaluation of generalized malaise, body aches and sore throat. Symptoms began yesterday, patient reports that she woke up with a much more severe sore throat this morning. She reports headache and generalized body aches. She has had nausea but no vomiting. There is no chest pain, cough or abdominal pain. Patient's husband reports that he was ill several days ago with similar but more minor symptoms.  Patient is a 64 y.o. female presenting with URI.  URI Presenting symptoms: fatigue, fever and sore throat   Associated symptoms: headaches and myalgias     Past Medical History  Diagnosis Date  . Osteoporosis   . Endometriosis   . Thyroid disease     Nodules  . Fracture of arm   . Palpitations   . Anemia   . Heart murmur   . High cholesterol    Past Surgical History  Procedure Laterality Date  . Tummy tuck    . Pelvic laparoscopy  1989   Family History  Problem Relation Age of Onset  . Hypertension Mother   . Diabetes Mother   . Osteoarthritis Mother   . Heart disease Mother   . Heart disease Father   . Alzheimer's disease Father   . Cancer Maternal Aunt     colon  . Breast cancer Paternal Aunt   . Heart disease Maternal Grandfather    History  Substance Use Topics  . Smoking status: Never Smoker   . Smokeless tobacco: Never Used  . Alcohol Use: 1.8 oz/week    3 Glasses of wine per week     Comment: per week   OB History    Gravida Para Term Preterm AB TAB SAB Ectopic Multiple Living   1 1 1        0     Review of Systems  Constitutional: Positive for fever and fatigue.  HENT: Positive for sore throat.   Musculoskeletal: Positive for myalgias.   Neurological: Positive for headaches.  All other systems reviewed and are negative.     Allergies  Tramadol  Home Medications   Prior to Admission medications   Medication Sig Start Date End Date Taking? Authorizing Provider  aspirin 81 MG tablet Take 81 mg by mouth as needed.     Historical Provider, MD  calcium carbonate (OS-CAL) 600 MG TABS Take 1,200 mg by mouth 2 (two) times daily with a meal.    Historical Provider, MD  cholecalciferol (VITAMIN D) 1000 UNITS tablet Take 25,000 Units by mouth daily. [place]    Historical Provider, MD  DHEA 10 MG CAPS Take by mouth.    Historical Provider, MD  ferrous sulfate 325 (65 FE) MG tablet Take 325 mg by mouth daily with breakfast.    Historical Provider, MD  fish oil-omega-3 fatty acids 1000 MG capsule Take 720 mg by mouth daily.     Historical Provider, MD  Menaquinone-7 (VITAMIN K2) 100 MCG CAPS Take by mouth.    Historical Provider, MD  milk thistle 175 MG tablet Take 175 mg by mouth daily. 250 MG 2 TIMES A DAY    Historical Provider, MD  Multiple Vitamin (MULTIVITAMIN) tablet Shaklee vit strip    Historical Provider, MD  OVER THE COUNTER MEDICATION shaklee multivitamin    Historical Provider, MD  PRESCRIPTION MEDICATION ADRENPLUS-300 2 TIMES A DAY    Historical Provider, MD  PRESCRIPTION MEDICATION [place] 2 DAILY    Historical Provider, MD  Red Yeast Rice Extract (RED YEAST RICE PO) Take 2 tablets by mouth daily.    Historical Provider, MD  UNABLE TO FIND Med Name:  [place] Provider, MD  zolpidem (AMBIEN) 5 MG tablet Take 1 tablet (5 mg total) by mouth at bedtime as needed for sleep. 10/03/10 10/03/11  Darlin Coco, MD   BP 98/68 mmHg  Pulse 110  Temp(Src) 101 F (38.3 C) (Oral)  Resp 20  Ht 5\' 5"  (1.651 m)  Wt 123 lb (55.792 kg)  BMI 20.47 kg/m2  SpO2 97% Physical Exam  Constitutional: She is oriented to person, place, and time. She appears well-developed and well-nourished. No  distress.  HENT:  Head: Normocephalic and atraumatic.  Right Ear: Hearing normal.  Left Ear: Hearing normal.  Nose: Nose normal.  Mouth/Throat: Oropharynx is clear and moist and mucous membranes are normal.  Eyes: Conjunctivae and EOM are normal. Pupils are equal, round, and reactive to light.  Neck: Normal range of motion. Neck supple. No Brudzinski's sign and no Kernig's sign noted.  Cardiovascular: Regular rhythm, S1 normal and S2 normal.  Exam reveals no gallop and no friction rub.   No murmur heard. Pulmonary/Chest: Effort normal and breath sounds normal. No respiratory distress. She exhibits no tenderness.  Abdominal: Soft. Normal appearance and bowel sounds are normal. There is no hepatosplenomegaly. There is no tenderness. There is no rebound, no guarding, no tenderness at McBurney's point and negative Murphy's sign. No hernia.  Musculoskeletal: Normal range of motion.  Neurological: She is alert and oriented to person, place, and time. She has normal strength. No cranial nerve deficit or sensory deficit. Coordination normal. GCS eye subscore is 4. GCS verbal subscore is 5. GCS motor subscore is 6.  Skin: Skin is warm, dry and intact. No rash noted. No cyanosis.  Psychiatric: She has a normal mood and affect. Her speech is normal and behavior is normal. Thought content normal.  Nursing note and vitals reviewed.   ED Course  Procedures (including critical care time) Labs Review Labs Reviewed  CBC WITH DIFFERENTIAL/PLATELET - Abnormal; Notable for the following:    Neutrophils Relative % 78 (*)    Lymphocytes Relative 7 (*)    Lymphs Abs 0.5 (*)    Monocytes Relative 15 (*)    All other components within normal limits  BASIC METABOLIC PANEL - Abnormal; Notable for the following:    Glucose, Bld 118 (*)    Calcium 8.8 (*)    All other components within normal limits  RAPID STREP SCREEN (NOT AT [facility])  CULTURE, GROUP A STREP    Imaging Review No results found.   EKG  Interpretation None      MDM   Final diagnoses:  None   URI  Resents to the ER for evaluation of fever, malaise, myalgias, headache, sore throat, nausea without vomiting. Examination was unremarkable other than fever and tachycardia. She has improved with Toradol, IV fluids. Rapid strep was negative. Blood work is normal. Symptoms likely viral in nature. Decadron for sore throat, continue Motrin and/or Tylenol for fever and myalgias. Zofran as needed for nausea. Follow-up with PCP.    Orpah Greek, MD 11/03/14 [phone]

## 2014-11-05 LAB — CULTURE, GROUP A STREP: Strep A Culture: NEGATIVE

## 2014-11-29 ENCOUNTER — Encounter: Payer: Self-pay | Admitting: Emergency Medicine

## 2014-11-29 ENCOUNTER — Ambulatory Visit (INDEPENDENT_AMBULATORY_CARE_PROVIDER_SITE_OTHER): Payer: BLUE CROSS/BLUE SHIELD | Admitting: Emergency Medicine

## 2014-11-29 VITALS — BP 108/64 | HR 82 | Temp 98.3°F | Resp 16 | Ht 64.5 in | Wt 123.4 lb

## 2014-11-29 DIAGNOSIS — E785 Hyperlipidemia, unspecified: Secondary | ICD-10-CM | POA: Diagnosis not present

## 2014-11-29 DIAGNOSIS — L659 Nonscarring hair loss, unspecified: Secondary | ICD-10-CM | POA: Diagnosis not present

## 2014-11-29 DIAGNOSIS — F4320 Adjustment disorder, unspecified: Secondary | ICD-10-CM

## 2014-11-29 DIAGNOSIS — E042 Nontoxic multinodular goiter: Secondary | ICD-10-CM

## 2014-11-29 DIAGNOSIS — F4321 Adjustment disorder with depressed mood: Secondary | ICD-10-CM | POA: Diagnosis not present

## 2014-11-29 LAB — CBC WITH DIFFERENTIAL/PLATELET
Basophils Absolute: 0 10*3/uL (ref 0.0–0.1)
Basophils Relative: 1 % (ref 0–1)
Eosinophils Absolute: 0.1 10*3/uL (ref 0.0–0.7)
Eosinophils Relative: 2 % (ref 0–5)
HCT: 42.6 % (ref 36.0–46.0)
Hemoglobin: 14 g/dL (ref 12.0–15.0)
Lymphocytes Relative: 37 % (ref 12–46)
Lymphs Abs: 1.5 10*3/uL (ref 0.7–4.0)
MCH: 26.8 pg (ref 26.0–34.0)
MCHC: 32.9 g/dL (ref 30.0–36.0)
MCV: 81.6 fL (ref 78.0–100.0)
MPV: 10.9 fL (ref 8.6–12.4)
Monocytes Absolute: 0.5 10*3/uL (ref 0.1–1.0)
Monocytes Relative: 11 % (ref 3–12)
Neutro Abs: 2 10*3/uL (ref 1.7–7.7)
Neutrophils Relative %: 49 % (ref 43–77)
Platelets: 279 10*3/uL (ref 150–400)
RBC: 5.22 MIL/uL — ABNORMAL HIGH (ref 3.87–5.11)
RDW: 13.6 % (ref 11.5–15.5)
WBC: 4.1 10*3/uL (ref 4.0–10.5)

## 2014-11-29 LAB — COMPLETE METABOLIC PANEL WITH GFR
ALT: 51 U/L — ABNORMAL HIGH (ref 0–35)
AST: 36 U/L (ref 0–37)
Albumin: 4.4 g/dL (ref 3.5–5.2)
Alkaline Phosphatase: 69 U/L (ref 39–117)
BUN: 10 mg/dL (ref 6–23)
CO2: 28 mEq/L (ref 19–32)
Calcium: 9.5 mg/dL (ref 8.4–10.5)
Chloride: 105 mEq/L (ref 96–112)
Creat: 0.68 mg/dL (ref 0.50–1.10)
GFR, Est African American: >89 mL/min
GFR, Est Non African American: >89 mL/min
Glucose, Bld: 83 mg/dL (ref 70–99)
Potassium: 4.4 mEq/L (ref 3.5–5.3)
Sodium: 144 mEq/L (ref 135–145)
Total Bilirubin: 0.6 mg/dL (ref 0.2–1.2)
Total Protein: 6.9 g/dL (ref 6.0–8.3)

## 2014-11-29 LAB — THYROID PANEL WITH TSH
Free Thyroxine Index: 2.7 (ref 1.4–3.8)
T3 Uptake: 31 % (ref 22–35)
T4, Total: 8.6 ug/dL (ref 4.5–12.0)
TSH: 0.909 u[IU]/mL (ref 0.350–4.500)

## 2014-11-29 LAB — SEDIMENTATION RATE: Sed Rate: 4 mm/hr (ref 0–30)

## 2014-11-29 LAB — LIPID PANEL
Cholesterol: 208 mg/dL — ABNORMAL HIGH (ref 0–200)
HDL: 53 mg/dL (ref >=46)
LDL Cholesterol: 132 mg/dL — ABNORMAL HIGH (ref 0–99)
Total CHOL/HDL Ratio: 3.9 Ratio
Triglycerides: 114 mg/dL (ref <150)
VLDL: 23 mg/dL (ref 0–40)

## 2014-11-29 NOTE — Progress Notes (Signed)
   Subjective:  This chart was scribed for Kelly Hahn , MD by Leandra Kern, Medical Scribe. This patient was seen in Room 21 and the patient's care was started at 8:55 AM.   Patient ID: Kelly Hahn, female    DOB: 08/17/1950, 64 y.o.   MRN: [id number]  HPI HPI Comments:  Kelly Hahn is a 64 y.o. female who presents to [facility] complaining of hair loss. Pt notes that she has been seeing Dr. Lois Huxley (Dermatology) for her problem who advised her that it could be a thyroid problem. Pt She reports that since starting taking DHEA she noticed hair loss problem, however she since stopped taking this medication months ago. Pt denied having any other thyroid related problems, or constipation.  Pt requests to get some blood work done on her. Last thyroid studies checked were normal, she is scheduled to have another one.   Pt notes that she has been going through a very tough time in the past few months. Pt reports that she has recently lost her father a month ago (10/27/2014). She also reports that her mother was recently diagnosed with uterian cancer. She also notes that her husand was diagnosed with Multiple Myeloma this past February.  she has lost her son 5 years ago, and Her aunt committed suicide in past November.Pt states that she did see a therapist for two years, however she is not currently been going to any therapy, she does have somebody to contact in case she needed somebody.   Pt has been seeing Dr. Toney Rakes  for mammograms.  Review of Systems  HENT: Positive for hearing loss.   Gastrointestinal: Negative for constipation.       Objective:   Physical Exam  Constitutional: She is oriented to person, place, and time. She appears well-developed and well-nourished. No distress.  Cooperative, conversing  HENT:  Head: Normocephalic and atraumatic.  mild thinning of hair, no significant hair loss noted.  Eyes: EOM are normal. Pupils are equal,  round, and reactive to light.  Neck: Neck supple.  tiny small bilateral thyroid nodules, no significant enlargement   Cardiovascular: Normal rate and regular rhythm.   Pulmonary/Chest: Effort normal and breath sounds normal. No respiratory distress.  Musculoskeletal: Normal range of motion. She exhibits no edema (no lower extremity edema).  reflexes are symmetrical   Neurological: She is alert and oriented to person, place, and time. No cranial nerve deficit.  Skin: Skin is warm and dry.  Psychiatric: She has a normal mood and affect. Her behavior is normal.  Nursing note and vitals reviewed.         Assessment & Plan:  We'll proceed with ultrasound of the thyroid and go ahead and do her routine labs today. She will return for the rest of her physical examination. Through this a significant amount of stress at the current time with a mother with cancer her father recently passed away her husband is battling multiple myeloma and she has a distant history 5 years ago of the loss of her son.I personally performed the services described in this documentation, which was scribed in my presence. The recorded information has been reviewed and is accurate.  Nena Jordan, MD

## 2014-11-30 LAB — VITAMIN D 25 HYDROXY (VIT D DEFICIENCY, FRACTURES): Vit D, 25-Hydroxy: 62 ng/mL (ref 30–100)

## 2014-12-06 ENCOUNTER — Other Ambulatory Visit: Payer: BLUE CROSS/BLUE SHIELD

## 2014-12-08 ENCOUNTER — Encounter: Payer: Self-pay | Admitting: Family Medicine

## 2014-12-10 ENCOUNTER — Ambulatory Visit
Admission: RE | Admit: 2014-12-10 | Discharge: 2014-12-10 | Disposition: A | Discharge status: Home / Self Care | Payer: BLUE CROSS/BLUE SHIELD | Source: Ambulatory Visit | Attending: Emergency Medicine | Admitting: Emergency Medicine

## 2014-12-10 DIAGNOSIS — E042 Nontoxic multinodular goiter: Secondary | ICD-10-CM

## 2014-12-13 ENCOUNTER — Encounter: Payer: BLUE CROSS/BLUE SHIELD | Admitting: Emergency Medicine

## 2014-12-19 ENCOUNTER — Encounter: Payer: BLUE CROSS/BLUE SHIELD | Admitting: Gynecology

## 2014-12-20 ENCOUNTER — Ambulatory Visit (INDEPENDENT_AMBULATORY_CARE_PROVIDER_SITE_OTHER): Payer: BLUE CROSS/BLUE SHIELD | Admitting: Emergency Medicine

## 2014-12-20 ENCOUNTER — Telehealth: Payer: Self-pay | Admitting: *Deleted

## 2014-12-20 ENCOUNTER — Encounter: Payer: Self-pay | Admitting: Emergency Medicine

## 2014-12-20 VITALS — BP 108/68 | HR 62 | Temp 98.6°F | Resp 16 | Ht 64.5 in | Wt 124.0 lb

## 2014-12-20 DIAGNOSIS — R9431 Abnormal electrocardiogram [ECG] [EKG]: Secondary | ICD-10-CM

## 2014-12-20 DIAGNOSIS — R7989 Other specified abnormal findings of blood chemistry: Secondary | ICD-10-CM

## 2014-12-20 DIAGNOSIS — R011 Cardiac murmur, unspecified: Secondary | ICD-10-CM

## 2014-12-20 DIAGNOSIS — R945 Abnormal results of liver function studies: Secondary | ICD-10-CM

## 2014-12-20 DIAGNOSIS — H539 Unspecified visual disturbance: Secondary | ICD-10-CM

## 2014-12-20 NOTE — Progress Notes (Addendum)
This chart was scribed for Kelly Queen, MD by Thea Alken, ED Scribe. This patient was seen in room 28 and the patient's care was started at 12:21 PM.  Chief Complaint:  Chief Complaint  Patient presents with  . Annual Exam    no pap, no bloodwork (already done)   HPI: Kelly Hahn is a 64 y.o. female who reports to [facility] today for a CPE.   She has been seeing a Paediatric nurse for hair loss. She was prescribed finasteride 5mg  which she has been taking for 1 month now. She was told she would see result in about 4 months. She had soft tissue neck ultrasound 10 days ago which showed normal thyroid.  Her cholesterol is elevated but she is willing to work on this. She plan on changing her diet and start exercising as well as drink more water and less sweet tea.   Pt has not Hep C test. Her last Hep C was in 2008 which was negative.    GYN-Dr. Toney Rakes, next visit being the end of July. Pt is UTD with mammogram and colonoscopy.  She is planning to make an appointment with opto.   Past Medical History  Diagnosis Date  . Osteoporosis   . Endometriosis   . Thyroid disease     Nodules  . Fracture of arm   . Palpitations   . Anemia   . Heart murmur   . High cholesterol    Past Surgical History  Procedure Laterality Date  . Tummy tuck    . Pelvic laparoscopy  1989   History   Social History  . Marital Status: Married    Spouse Name: N/A  . Number of Children: N/A  . Years of Education: N/A   Social History Main Topics  . Smoking status: Never Smoker   . Smokeless tobacco: Never Used  . Alcohol Use: 1.8 oz/week    3 Glasses of wine per week     Comment: per week  . Drug Use: No  . Sexual Activity: Yes    Birth Control/ Protection: Post-menopausal   Other Topics Concern  . None   Social History Narrative   Family History  Problem Relation Age of Onset  . Hypertension Mother   . Diabetes Mother   . Osteoarthritis Mother   . Heart disease Mother   .  Heart disease Father   . Alzheimer's disease Father   . Cancer Maternal Aunt     colon  . Breast cancer Paternal Aunt   . Heart disease Maternal Grandfather    Allergies  Allergen Reactions  . Tramadol Other (See Comments)    "doesn't remember"    Prior to Admission medications   Medication Sig Start Date End Date Taking? Authorizing Provider  aspirin 81 MG tablet Take 81 mg by mouth as needed.    Yes Historical Provider, MD  calcium carbonate (OS-CAL) 600 MG TABS Take 1,200 mg by mouth 2 (two) times daily with a meal.   Yes Historical Provider, MD  cholecalciferol (VITAMIN D) 1000 UNITS tablet Take 25,000 Units by mouth daily. [place]   Yes Historical Provider, MD  finasteride (PROSCAR) 5 MG tablet Take 5 mg by mouth daily.   Yes Historical Provider, MD  Multiple Vitamin (MULTIVITAMIN) tablet Shaklee vit strip   Yes Historical Provider, MD  OVER THE COUNTER MEDICATION shaklee multivitamin   Yes Historical Provider, MD     ROS: The patient denies fevers, chills, night sweats, unintentional  weight loss, chest pain, palpitations, wheezing, dyspnea on exertion, nausea, vomiting, abdominal pain, dysuria, hematuria, melena, numbness, weakness, or tingling.   All other systems have been reviewed and were otherwise negative with the exception of those mentioned in the HPI and as above.    PHYSICAL EXAM: Filed Vitals:   12/20/14 1202  BP: 108/68  Pulse: 62  Temp: 98.6 F (37 C)  Resp: 16   Body mass index is 20.96 kg/(m^2).   General: Alert, no acute distress HEENT:  Normocephalic, atraumatic, oropharynx patent. Eye: [facility]te M[facility] Cardiovascular:  Regular rate and rhythm, no rubs murmurs or gallops.  No Carotid bruits, radial pulse intact. No pedal edema. Grade 2 systolic murmur at left sternal border.  Respiratory: Clear to auscultation bilaterally.  No wheezes, rales, or rhonchi.  No cyanosis, no use of accessory musculature Abdominal: No organomegaly, abdomen is soft  and non-tender, positive bowel sounds.  No masses. Musculoskeletal: Gait intact. No edema, tenderness Skin: No rashes. She has one 4 mm papule beneath left breast. No surrounding erythema.  Neurologic: Facial musculature symmetric. Psychiatric: Patient acts appropriately throughout our interaction. Lymphatic: No cervical or submandibular lymphadenopathy Genitourinary/Anorectal: No acute findings  LABS:  EKG/XRAY:   Primary read interpreted by Dr. Everlene Farrier at [facility].  Patient does have a mild prolongation of QT. There is mild ST depression V3 minimal. Essentially unchanged from previous EKG   ASSESSMENT/PLAN: 1. Heart murmur  - EKG 12-Lead - Ambulatory referral to Cardiology there have been some mild changes in her EKG we'll go ahead and see when we can get her in to see the cardiologist. She is asymptomatic.  2. Abnormal liver function test  - COMPLETE METABOLIC PANEL WITH GFR; Future - Lipid panel; Future - Hepatitis C Ab Reflex HCV RNA, QUANT; Future  3. Nonspecific abnormal electrocardiogram (ECG) (EKG)  - Ambulatory referral to Cardiology changes noted are mild in lead the 3 there is mild prolongation of QT compared to her previous EKG of 2013  4. She did have some redness of her scalp I suspect secondary to the medication she is on an will call her dermatologist about that I personally performed the services described in this documentation, which was scribed in my presence. The recorded information has been reviewed and is accurate.  Kelly Queen, MD  [facility]  12/20/2014 1:48 PM   Gross sideeffects, risk and benefits, and alternatives of medications d/w patient. Patient is aware that all medications have potential sideeffects and we are unable to predict every sideeffect or drug-drug interaction that may occur.  Kelly Queen MD 12/20/2014 12:07 PM    From a

## 2014-12-20 NOTE — Progress Notes (Deleted)
   Subjective:    Patient ID: Kelly Hahn, female    DOB: 12-01-1950, 64 y.o.   MRN: [id number]  HPI    Review of Systems  Constitutional: Negative.   HENT: Negative.   Eyes: Positive for visual disturbance.  Respiratory: Negative.   Cardiovascular: Negative.   Gastrointestinal: Negative.   Endocrine: Negative.   Genitourinary: Negative.   Musculoskeletal: Negative.   Skin: Negative.   Allergic/Immunologic: Negative.   Neurological: Negative.   Hematological: Negative.   Psychiatric/Behavioral: Negative.        Objective:   Physical Exam        Assessment & Plan:

## 2014-12-20 NOTE — Addendum Note (Signed)
Addended by: [name] A on: 12/20/2014 02:08 PM   Modules accepted: Level of Service

## 2014-12-20 NOTE — Telephone Encounter (Signed)
Pt is schedule to see a Maxcine Ham, NP at Dr. Einar Gip office at 3pm on Monday 12/24/14.  Could not get in to Dr. Warren Lacy office until September and Dr. Everlene Farrier felt that she should be seen sooner.  Lmom with information.  Dr. Einar Gip office will call tom again.

## 2014-12-28 ENCOUNTER — Ambulatory Visit (INDEPENDENT_AMBULATORY_CARE_PROVIDER_SITE_OTHER): Payer: BLUE CROSS/BLUE SHIELD | Admitting: Gynecology

## 2014-12-28 ENCOUNTER — Encounter: Payer: Self-pay | Admitting: Gynecology

## 2014-12-28 VITALS — BP 116/70 | Ht 64.5 in | Wt 123.0 lb

## 2014-12-28 DIAGNOSIS — M810 Age-related osteoporosis without current pathological fracture: Secondary | ICD-10-CM | POA: Diagnosis not present

## 2014-12-28 DIAGNOSIS — Z01419 Encounter for gynecological examination (general) (routine) without abnormal findings: Secondary | ICD-10-CM | POA: Diagnosis not present

## 2014-12-28 NOTE — Progress Notes (Signed)
Kelly Hahn Nov 25, 1950 [id number]   History:    64 y.o.  for annual gyn exam with no complaints today. Review of patient's history indicated that she has a previous history of osteoporosis has refused medical treatment. Her last bone density study 2011 demonstrated her lowest T score was -2.8 at the [facility]. Patient had a history of a traumatic fracture of her arm in January 2013 and is currently taking calcium and vitamin D on a regular basis. Her bone density study in 2015 demonstrated her lowest T score was -3.0 the [facility] indicating osteoporosis. When the study was compared with previous study of 2011 the radiologist at documented that there was no statistically significant change from that time. Patient has refused any antiresorptive treatment and she is relying on calcium vitamin D and regular exercise despite counseling. She currently has a BMI of 20 and she feels that she has gaining too much weight and is on 8 total pituitary diet.Patient been followed by her primary physician Dr. Everlene Farrier for multinodular goiter as well as for hyperlipidemia.Patient had a colonoscopy in 2012 and a benign polyp had been detected. Patient reports no past history of any abnormal Pap smears. Her vaccines are all up-to-date. Patient currently on no hormone replacement therapy.  Past medical history,surgical history, family history and social history were all reviewed and documented in the EPIC chart.  Gynecologic History No LMP recorded. Patient is postmenopausal. Contraception: post menopausal status Last Pap: 2015. Results were: normal Last mammogram: 2016. Results were: Dense breasts normal three-dimensional mammogram  Obstetric History OB History  Gravida Para Term Preterm AB SAB TAB Ectopic Multiple Living  1 1 1        0    # Outcome Date GA Lbr Len/2nd Weight Sex Delivery Anes PTL Lv  1 Term                ROS: A ROS was performed and pertinent positives and negatives are included in the  history.  GENERAL: No fevers or chills. HEENT: No change in vision, no earache, sore throat or sinus congestion. NECK: No pain or stiffness. CARDIOVASCULAR: No chest pain or pressure. No palpitations. PULMONARY: No shortness of breath, cough or wheeze. GASTROINTESTINAL: No abdominal pain, nausea, vomiting or diarrhea, melena or bright red blood per rectum. GENITOURINARY: No urinary frequency, urgency, hesitancy or dysuria. MUSCULOSKELETAL: No joint or muscle pain, no back pain, no recent trauma. DERMATOLOGIC: No rash, no itching, no lesions. ENDOCRINE: No polyuria, polydipsia, no heat or cold intolerance. No recent change in weight. HEMATOLOGICAL: No anemia or easy bruising or bleeding. NEUROLOGIC: No headache, seizures, numbness, tingling or weakness. PSYCHIATRIC: No depression, no loss of interest in normal activity or change in sleep pattern.     Exam: chaperone present  BP 116/70 mmHg  Ht 5' 4.5" (1.638 m)  Wt 123 lb (55.792 kg)  BMI 20.79 kg/m2  Body mass index is 20.79 kg/(m^2).  General appearance : Well developed well nourished female. No acute distress HEENT: Eyes: no retinal hemorrhage or exudates,  Neck supple, trachea midline, no carotid bruits, no thyroidmegaly Lungs: Clear to auscultation, no rhonchi or wheezes, or rib retractions  Heart: Regular rate and rhythm, no murmurs or gallops Breast:Examined in sitting and supine position were symmetrical in appearance, no palpable masses or tenderness,  no skin retraction, no nipple inversion, no nipple discharge, no skin discoloration, no axillary or supraclavicular lymphadenopathy Abdomen: no palpable masses or tenderness, no rebound or guarding Extremities: no edema or  skin discoloration or tenderness  Pelvic:  Bartholin, Urethra, Skene Glands: Within normal limits             Vagina: No gross lesions or discharge, atrophic changes  Cervix: No gross lesions or discharge  Uterus  anteverted, normal size, shape and consistency,  non-tender and mobile  Adnexa  Without masses or tenderness  Anus and perineum  normal   Rectovaginal  normal sphincter tone without palpated masses or tenderness             Hemoccult cards provided     Assessment/Plan:  64 y.o. female for annual exam with history of osteoporosis with the lowest T score -3.0 the [facility]. Despite medical recommendation to start antiresorptive therapy patient has declined. She's currently on calcium and vitamin D and weightbearing exercises and would like to wait until bone density study next year to make a determination. Her PCP recently did her blood work during include a normal calcium and vitamin D level. Pap smear not done today according to the new guidelines. We'll see her back in one year or when necessary.   Terrance Mass MD, 11:11 AM 12/28/2014

## 2015-01-07 DIAGNOSIS — F419 Anxiety disorder, unspecified: Secondary | ICD-10-CM | POA: Diagnosis not present

## 2015-01-07 DIAGNOSIS — R740 Nonspecific elevation of levels of transaminase and lactic acid dehydrogenase [LDH]: Secondary | ICD-10-CM | POA: Diagnosis not present

## 2015-01-07 DIAGNOSIS — Z6820 Body mass index (BMI) 20.0-20.9, adult: Secondary | ICD-10-CM | POA: Diagnosis not present

## 2015-01-07 DIAGNOSIS — Z1389 Encounter for screening for other disorder: Secondary | ICD-10-CM | POA: Diagnosis not present

## 2015-01-07 DIAGNOSIS — L659 Nonscarring hair loss, unspecified: Secondary | ICD-10-CM | POA: Diagnosis not present

## 2015-01-07 DIAGNOSIS — R011 Cardiac murmur, unspecified: Secondary | ICD-10-CM | POA: Diagnosis not present

## 2015-01-07 DIAGNOSIS — M810 Age-related osteoporosis without current pathological fracture: Secondary | ICD-10-CM | POA: Diagnosis not present

## 2015-02-08 ENCOUNTER — Ambulatory Visit (INDEPENDENT_AMBULATORY_CARE_PROVIDER_SITE_OTHER): Payer: Medicare Other | Admitting: Cardiology

## 2015-02-08 ENCOUNTER — Encounter: Payer: Self-pay | Admitting: Cardiology

## 2015-02-08 VITALS — BP 108/80 | HR 87 | Ht 65.0 in | Wt 122.0 lb

## 2015-02-08 DIAGNOSIS — I493 Ventricular premature depolarization: Secondary | ICD-10-CM

## 2015-02-08 NOTE — Patient Instructions (Signed)
Your physician recommends that you schedule a follow-up appointment in: As Needed    

## 2015-02-08 NOTE — Progress Notes (Signed)
Cardiology Office Note   Date:  02/08/2015   ID:  Kelly Hahn, DOB 1951-03-03, MRN [id number]  PCP:  Jenny Reichmann, MD  Cardiologist:   Minus Breeding, MD   Chief Complaint  Patient presents with  . New Evaluation    NO COMPLAINTS.      History of Present Illness: Kelly Hahn is a 64 y.o. female who presents for evaluation of heart murmur. The patient has no past cardiac history although I do see a previous coronary calcium score was 0. She has been under a tremendous amount of stress over the past several years. She's had palpitations related to this although this has improved slightly over time. She was noted recently to have a heart murmur. She's never been told this before. She presents for evaluation of this. She walks her dog daily. With this level of activity she denies any cardiovascular symptoms. The patient denies any new symptoms such as chest discomfort, neck or arm discomfort. There has been no new shortness of breath, PND or orthopnea. There have been no reported palpitations, presyncope or syncope.    Past Medical History  Diagnosis Date  . Osteoporosis   . Endometriosis   . Thyroid disease     Nodules  . Fracture of arm   . Palpitations   . Anemia   . Heart murmur   . High cholesterol     Past Surgical History  Procedure Laterality Date  . Tummy tuck    . Pelvic laparoscopy  1989  . Cesarean section       Current Outpatient Prescriptions  Medication Sig Dispense Refill  . aspirin 81 MG tablet Take 81 mg by mouth as needed.     Marland Kitchen b complex vitamins tablet Take 1 tablet by mouth daily.    . Biotin 5000 MCG CAPS Take 5,000 mcg by mouth daily.    . cholecalciferol (VITAMIN D) 1000 UNITS tablet Take 2,000 Units by mouth 2 (two) times daily. 4 TIMES A WEEK    . OVER THE COUNTER MEDICATION shaklee multivitamin    . OVER THE COUNTER MEDICATION shaklee - dietary supplements    . OVER THE COUNTER MEDICATION osteomatrix- calcium supplement       No current facility-administered medications for this visit.    Allergies:   Tramadol    Social History:  The patient  reports that she has never smoked. She has never used smokeless tobacco. She reports that she drinks about 1.8 oz of alcohol per week. She reports that she does not use illicit drugs.   Family History:  The patient's family history includes Alzheimer's disease in her father; Breast cancer in her paternal aunt; Cancer in her maternal aunt; Diabetes in her mother; Heart disease in her father, maternal grandfather, and mother; Hypertension in her mother; Osteoarthritis in her mother.    ROS:  Please see the history of present illness.   Otherwise, review of systems are positive for none.   All other systems are reviewed and negative.    PHYSICAL EXAM: VS:  BP 108/80 mmHg  Pulse 87  Ht 5\' 5"  (1.651 m)  Wt 122 lb (55.339 kg)  BMI 20.30 kg/m2 , BMI Body mass index is 20.3 kg/(m^2). GENERAL:  Well appearing HEENT:  Pupils equal round and reactive, fundi not visualized, oral mucosa unremarkable NECK:  No jugular venous distention, waveform within normal limits, carotid upstroke brisk and symmetric, no bruits, no thyromegaly LYMPHATICS:  No cervical, inguinal adenopathy LUNGS:  Clear to auscultation bilaterally BACK:  No CVA tenderness CHEST:  Unremarkable HEART:  PMI not displaced or sustained,S1 and S2 within normal limits, no S3, no S4, no clicks, no rubs, 2 out of 6 apical systolic murmur heard best at the right upper sternal border, no radiation, very early peaking, no change with Valsalva, no diastolicmurmurs ABD:  Flat, positive bowel sounds normal in frequency in pitch, no bruits, no rebound, no guarding, no midline pulsatile mass, no hepatomegaly, no splenomegaly EXT:  2 plus pulses throughout, no edema, no cyanosis no clubbing SKIN:  No rashes no nodules NEURO:  Cranial nerves II through XII grossly intact, motor grossly intact throughout PSYCH:  Cognitively  intact, oriented to person place and time    EKG:  EKG is not ordered today. The ekg ordered 12/20/14  Sinus rhythm, rate 58, axis within normal limits, intervals within normal limits, no acute ST-T wave changes.   Recent Labs: 11/29/2014: ALT 51*; BUN 10; Creat 0.68; Hemoglobin 14.0; Platelets 279; Potassium 4.4; Sodium 144; TSH 0.909    Lipid Panel    Component Value Date/Time   CHOL 208* 11/29/2014 0924   TRIG 114 11/29/2014 0924   HDL 53 11/29/2014 0924   CHOLHDL 3.9 11/29/2014 0924   VLDL 23 11/29/2014 0924   LDLCALC 132* 11/29/2014 0924      Wt Readings from Last 3 Encounters:  02/08/15 122 lb (55.339 kg)  12/28/14 123 lb (55.792 kg)  12/20/14 124 lb (56.246 kg)      Other studies Reviewed: Additional studies/ records that were reviewed today include: EKG, DAUB, STEVE A, MD note. Review of the above records demonstrates:  Please see elsewhere in the note.     ASSESSMENT AND PLAN:  MURMUR:  I think she has a flow murmur. It could represent some mild aortic sclerosis but don't think it's significant structural heart disease. We can listen to this over the years and if it increases in intensity than she could get echocardiography. However, I think at this point is reasonable to follow this with physical exam only.  ANXIETY:   We talked a lot about this and the stress provoking events in her life and she is going to be seeking other therapy for this although she has done a lot of self-help. I talked in particular about exercise as a therapy.  PALPITATIONS:  These are slowly improving. No change in therapy is indicated.  DYSLIPIDEMIA:  Her LDL is as above. She has plans for diet controlled I agree with this.    Current medicines are reviewed at length with the patient today.  The patient does not have concerns regarding medicines.  The following changes have been made:  no change  Labs/ tests ordered today include: None     Disposition:   FU with me as needed.       Signed, Minus Breeding, MD  02/08/2015 4:08 PM    [facility]

## 2015-03-05 ENCOUNTER — Telehealth: Payer: Self-pay | Admitting: *Deleted

## 2015-03-05 NOTE — Telephone Encounter (Signed)
Called lmom for pt to come in for blood test

## 2015-03-05 NOTE — Telephone Encounter (Signed)
-----   Message from Darlyne Russian, MD sent at 01/25/2015  7:25 AM EDT ----- Will you please call patient and see that she comes in for a hep C test. ----- Message -----    From: SYSTEM    Sent: 01/25/2015  12:05 AM      To: Darlyne Russian, MD

## 2015-08-15 DIAGNOSIS — M810 Age-related osteoporosis without current pathological fracture: Secondary | ICD-10-CM | POA: Diagnosis not present

## 2015-09-10 ENCOUNTER — Telehealth: Payer: Self-pay | Admitting: *Deleted

## 2015-09-10 NOTE — Telephone Encounter (Signed)
Pt aware order faxed at [phone]

## 2015-09-10 NOTE — Telephone Encounter (Signed)
Yes we can check a vitamin B12 level at the same time

## 2015-09-10 NOTE — Telephone Encounter (Signed)
Pt told to schedule consult with you due to osteoporosis will have vitamin d drawn, PTH, calcium drawn. Pt takes B12 and asked if B12 level can be checked as well, she would like to make sure she is getting enough. Please advise

## 2015-09-20 ENCOUNTER — Encounter: Payer: Self-pay | Admitting: Gynecology

## 2015-10-17 DIAGNOSIS — Z0000 Encounter for general adult medical examination without abnormal findings: Secondary | ICD-10-CM | POA: Diagnosis not present

## 2015-10-17 DIAGNOSIS — M810 Age-related osteoporosis without current pathological fracture: Secondary | ICD-10-CM | POA: Diagnosis not present

## 2015-11-15 DIAGNOSIS — M810 Age-related osteoporosis without current pathological fracture: Secondary | ICD-10-CM | POA: Diagnosis not present

## 2015-11-15 DIAGNOSIS — E784 Other hyperlipidemia: Secondary | ICD-10-CM | POA: Diagnosis not present

## 2015-11-15 DIAGNOSIS — E048 Other specified nontoxic goiter: Secondary | ICD-10-CM | POA: Diagnosis not present

## 2015-11-21 DIAGNOSIS — Z6821 Body mass index (BMI) 21.0-21.9, adult: Secondary | ICD-10-CM | POA: Diagnosis not present

## 2015-11-21 DIAGNOSIS — Z0000 Encounter for general adult medical examination without abnormal findings: Secondary | ICD-10-CM | POA: Diagnosis not present

## 2015-11-21 DIAGNOSIS — F4321 Adjustment disorder with depressed mood: Secondary | ICD-10-CM | POA: Diagnosis not present

## 2015-11-21 DIAGNOSIS — L659 Nonscarring hair loss, unspecified: Secondary | ICD-10-CM | POA: Diagnosis not present

## 2015-11-21 DIAGNOSIS — Z1389 Encounter for screening for other disorder: Secondary | ICD-10-CM | POA: Diagnosis not present

## 2015-11-21 DIAGNOSIS — M810 Age-related osteoporosis without current pathological fracture: Secondary | ICD-10-CM | POA: Diagnosis not present

## 2015-11-21 DIAGNOSIS — E784 Other hyperlipidemia: Secondary | ICD-10-CM | POA: Diagnosis not present

## 2015-11-21 DIAGNOSIS — R011 Cardiac murmur, unspecified: Secondary | ICD-10-CM | POA: Diagnosis not present

## 2015-11-22 DIAGNOSIS — Z1212 Encounter for screening for malignant neoplasm of rectum: Secondary | ICD-10-CM | POA: Diagnosis not present

## 2015-11-28 DIAGNOSIS — Z6821 Body mass index (BMI) 21.0-21.9, adult: Secondary | ICD-10-CM | POA: Diagnosis not present

## 2015-11-28 DIAGNOSIS — M810 Age-related osteoporosis without current pathological fracture: Secondary | ICD-10-CM | POA: Diagnosis not present

## 2015-12-11 DIAGNOSIS — L720 Epidermal cyst: Secondary | ICD-10-CM | POA: Diagnosis not present

## 2015-12-30 ENCOUNTER — Ambulatory Visit (INDEPENDENT_AMBULATORY_CARE_PROVIDER_SITE_OTHER): Payer: Medicare Other | Admitting: Gynecology

## 2015-12-30 ENCOUNTER — Encounter: Payer: Self-pay | Admitting: Gynecology

## 2015-12-30 VITALS — BP 112/70 | Ht 64.5 in | Wt 125.0 lb

## 2015-12-30 DIAGNOSIS — Z01419 Encounter for gynecological examination (general) (routine) without abnormal findings: Secondary | ICD-10-CM | POA: Diagnosis not present

## 2015-12-30 DIAGNOSIS — M810 Age-related osteoporosis without current pathological fracture: Secondary | ICD-10-CM

## 2015-12-30 NOTE — Patient Instructions (Addendum)
Zoledronic Acid injection (Paget's Disease, Osteoporosis) What is this medicine? ZOLEDRONIC ACID (ZOE le dron ik AS id) lowers the amount of calcium loss from bone. It is used to treat Paget's disease and osteoporosis in women. This medicine may be used for other purposes; ask your health care provider or pharmacist if you have questions. What should I tell my health care provider before I take this medicine? They need to know if you have any of these conditions: -aspirin-sensitive asthma -cancer, especially if you are receiving medicines used to treat cancer -dental disease or wear dentures -infection -kidney disease -low levels of calcium in the blood -past surgery on the parathyroid gland or intestines -receiving corticosteroids like dexamethasone or prednisone -an unusual or allergic reaction to zoledronic acid, other medicines, foods, dyes, or preservatives -pregnant or trying to get pregnant -breast-feeding How should I use this medicine? This medicine is for infusion into a vein. It is given by a health care professional in a hospital or clinic setting. Talk to your pediatrician regarding the use of this medicine in children. This medicine is not approved for use in children. Overdosage: If you think you have taken too much of this medicine contact a poison control center or emergency room at once. NOTE: This medicine is only for you. Do not share this medicine with others. What if I miss a dose? It is important not to miss your dose. Call your doctor or health care professional if you are unable to keep an appointment. What may interact with this medicine? -certain antibiotics given by injection -NSAIDs, medicines for pain and inflammation, like ibuprofen or naproxen -some diuretics like bumetanide, furosemide -teriparatide This list may not describe all possible interactions. Give your health care provider a list of all the medicines, herbs, non-prescription drugs, or dietary  supplements you use. Also tell them if you smoke, drink alcohol, or use illegal drugs. Some items may interact with your medicine. What should I watch for while using this medicine? Visit your doctor or health care professional for regular checkups. It may be some time before you see the benefit from this medicine. Do not stop taking your medicine unless your doctor tells you to. Your doctor may order blood tests or other tests to see how you are doing. Women should inform their doctor if they wish to become pregnant or think they might be pregnant. There is a potential for serious side effects to an unborn child. Talk to your health care professional or pharmacist for more information. You should make sure that you get enough calcium and vitamin D while you are taking this medicine. Discuss the foods you eat and the vitamins you take with your health care professional. Some people who take this medicine have severe bone, joint, and/or muscle pain. This medicine may also increase your risk for jaw problems or a broken thigh bone. Tell your doctor right away if you have severe pain in your jaw, bones, joints, or muscles. Tell your doctor if you have any pain that does not go away or that gets worse. Tell your dentist and dental surgeon that you are taking this medicine. You should not have major dental surgery while on this medicine. See your dentist to have a dental exam and fix any dental problems before starting this medicine. Take good care of your teeth while on this medicine. Make sure you see your dentist for regular follow-up appointments. What side effects may I notice from receiving this medicine? Side effects that you should  report to your doctor or health care professional as soon as possible: -allergic reactions like skin rash, itching or hives, swelling of the face, lips, or tongue -anxiety, confusion, or depression -breathing problems -changes in vision -eye pain -feeling faint or  lightheaded, falls -jaw pain, especially after dental work -mouth sores -muscle cramps, stiffness, or weakness -redness, blistering, peeling or loosening of the skin, including inside the mouth -trouble passing urine or change in the amount of urine Side effects that usually do not require medical attention (report to your doctor or health care professional if they continue or are bothersome): -bone, joint, or muscle pain -constipation -diarrhea -fever -hair loss -irritation at site where injected -loss of appetite -nausea, vomiting -stomach upset -trouble sleeping -trouble swallowing -weak or tired This list may not describe all possible side effects. Call your doctor for medical advice about side effects. You may report side effects to FDA at 1-800-FDA-1088. Where should I keep my medicine? This drug is given in a hospital or clinic and will not be stored at home. NOTE: This sheet is a summary. It may not cover all possible information. If you have questions about this medicine, talk to your doctor, pharmacist, or health care provider.    2016, Elsevier/Gold Standard. (2013-10-14 14:19:57) Teriparatide injection What is this medicine? TERIPARATIDE (terr ih PAR a tyd) increases bone mass and strength. It helps make healthy bone and to slow bone loss. This medicine is used to prevent bone fractures. This medicine may be used for other purposes; ask your health care provider or pharmacist if you have questions. What should I tell my health care provider before I take this medicine? They need to know if you have any of these conditions: -bone disease other than osteoporosis -heart, kidney or liver disease -history of cancer in the bone -kidney stone -Paget's disease -parathyroid disease -receiving radiation therapy -an unusual or allergic reaction to teriparatide, other medicines, foods, dyes, or preservatives -pregnant or trying to get pregnant -breast-feeding How should I  use this medicine? This medicine comes in an injection pen device. This pen injects the medicine under your skin. Follow the directions on the prescription label. You will be taught how to use this medicine. Take your medicine at regular intervals. Do not take your medicine more often than directed. Do not use this medicine if it has solid particles in it, or if it is cloudy or colored. It should be clear and colorless. Be sure that you are using your pen device correctly. A special MedGuide will be given to you by the pharmacist with each prescription and refill. Be sure to read this information carefully each time. Talk to your pediatrician regarding the use of this medicine in children. Special care may be needed. Overdosage: If you think you have taken too much of this medicine contact a poison control center or emergency room at once. NOTE: This medicine is only for you. Do not share this medicine with others. What if I miss a dose? If you miss a dose, take it as soon as you can. If it is almost time for your next dose, take only that dose. Do not take double or extra doses. What may interact with this medicine? -digoxin -other medicines to strengthen bone This list may not describe all possible interactions. Give your health care provider a list of all the medicines, herbs, non-prescription drugs, or dietary supplements you use. Also tell them if you smoke, drink alcohol, or use illegal drugs. Some items may interact  with your medicine. What should I watch for while using this medicine? Visit your doctor or health care professional for regular checks on your progress. Your doctor may order blood tests and other tests to see how you are doing. You should make sure you get enough calcium and vitamin D while you are taking this medicine, unless your doctor tells you not to. Discuss the foods you eat and the vitamins you take with your health care professional. Dennis Bast may get drowsy or dizzy. Do not  drive, use machinery, or do anything that needs mental alertness until you know how this medicine affects you. Do not stand or sit up quickly, especially if you are an older patient. This reduces the risk of dizzy or fainting spells. What side effects may I notice from receiving this medicine? Side effects that you should report to your doctor or health care professional as soon as possible: -allergic reactions like skin rash, itching or hives, swelling of the face, lips, or tongue -chronic constipation -high blood pressure -irregular heartbeat, chest pain -nausea, vomiting -unusually weak or tired Side effects that usually do not require medical attention (report to your doctor or health care professional if they continue or are bothersome): -bone pain -cough, runny nose -headache -leg cramps -muscle spasms in the back or legs -pain, redness, irritation or swelling at the injection site -stomach upset -trouble sleeping This list may not describe all possible side effects. Call your doctor for medical advice about side effects. You may report side effects to FDA at 1-800-FDA-1088. Where should I keep my medicine? Keep out of the reach of children. Store in a refrigerator between 2 and 8 degrees C (36 and 46 degrees F). Do not freeze. Recap the pen injector when not in use to protect it from light and damage. Use quickly after taking out of the refrigerator and return to refrigerator right after using. Throw away any unused medicine 28 days after the first injection from the device. Do not use after the expiration date printed on the pen and pen packaging. NOTE: This sheet is a summary. It may not cover all possible information. If you have questions about this medicine, talk to your doctor, pharmacist, or health care provider.    2016, Elsevier/Gold Standard. (2008-05-15 17:45:37) Denosumab injection What is this medicine? DENOSUMAB (den oh sue mab) slows bone breakdown. Prolia is used to  treat osteoporosis in women after menopause and in men. Delton See is used to prevent bone fractures and other bone problems caused by cancer bone metastases. Delton See is also used to treat giant cell tumor of the bone. This medicine may be used for other purposes; ask your health care provider or pharmacist if you have questions. What should I tell my health care provider before I take this medicine? They need to know if you have any of these conditions: -dental disease -eczema -infection or history of infections -kidney disease or on dialysis -low blood calcium or vitamin D -malabsorption syndrome -scheduled to have surgery or tooth extraction -taking medicine that contains denosumab -thyroid or parathyroid disease -an unusual reaction to denosumab, other medicines, foods, dyes, or preservatives -pregnant or trying to get pregnant -breast-feeding How should I use this medicine? This medicine is for injection under the skin. It is given by a health care professional in a hospital or clinic setting. If you are getting Prolia, a special MedGuide will be given to you by the pharmacist with each prescription and refill. Be sure to read this information carefully  each time. For Prolia, talk to your pediatrician regarding the use of this medicine in children. Special care may be needed. For Delton See, talk to your pediatrician regarding the use of this medicine in children. While this drug may be prescribed for children as young as 13 years for selected conditions, precautions do apply. Overdosage: If you think you have taken too much of this medicine contact a poison control center or emergency room at once. NOTE: This medicine is only for you. Do not share this medicine with others. What if I miss a dose? It is important not to miss your dose. Call your doctor or health care professional if you are unable to keep an appointment. What may interact with this medicine? Do not take this medicine with any of  the following medications: -other medicines containing denosumab This medicine may also interact with the following medications: -medicines that suppress the immune system -medicines that treat cancer -steroid medicines like prednisone or cortisone This list may not describe all possible interactions. Give your health care provider a list of all the medicines, herbs, non-prescription drugs, or dietary supplements you use. Also tell them if you smoke, drink alcohol, or use illegal drugs. Some items may interact with your medicine. What should I watch for while using this medicine? Visit your doctor or health care professional for regular checks on your progress. Your doctor or health care professional may order blood tests and other tests to see how you are doing. Call your doctor or health care professional if you get a cold or other infection while receiving this medicine. Do not treat yourself. This medicine may decrease your body's ability to fight infection. You should make sure you get enough calcium and vitamin D while you are taking this medicine, unless your doctor tells you not to. Discuss the foods you eat and the vitamins you take with your health care professional. See your dentist regularly. Brush and floss your teeth as directed. Before you have any dental work done, tell your dentist you are receiving this medicine. Do not become pregnant while taking this medicine or for 5 months after stopping it. Women should inform their doctor if they wish to become pregnant or think they might be pregnant. There is a potential for serious side effects to an unborn child. Talk to your health care professional or pharmacist for more information. What side effects may I notice from receiving this medicine? Side effects that you should report to your doctor or health care professional as soon as possible: -allergic reactions like skin rash, itching or hives, swelling of the face, lips, or  tongue -breathing problems -chest pain -fast, irregular heartbeat -feeling faint or lightheaded, falls -fever, chills, or any other sign of infection -muscle spasms, tightening, or twitches -numbness or tingling -skin blisters or bumps, or is dry, peels, or red -slow healing or unexplained pain in the mouth or jaw -unusual bleeding or bruising Side effects that usually do not require medical attention (Report these to your doctor or health care professional if they continue or are bothersome.): -muscle pain -stomach upset, gas This list may not describe all possible side effects. Call your doctor for medical advice about side effects. You may report side effects to FDA at 1-800-FDA-1088. Where should I keep my medicine? This medicine is only given in a clinic, doctor's office, or other health care setting and will not be stored at home. NOTE: This sheet is a summary. It may not cover all possible information. If you  have questions about this medicine, talk to your doctor, pharmacist, or health care provider.    2016, Elsevier/Gold Standard. (2011-11-16 12:37:47) Alendronate tablets What is this medicine? ALENDRONATE (a LEN droe nate) slows calcium loss from bones. It helps to make normal healthy bone and to slow bone loss in people with Paget's disease and osteoporosis. It may be used in others at risk for bone loss. This medicine may be used for other purposes; ask your health care provider or pharmacist if you have questions. What should I tell my health care provider before I take this medicine? They need to know if you have any of these conditions: -dental disease -esophagus, stomach, or intestine problems, like acid reflux or GERD -kidney disease -low blood calcium -low vitamin D -problems sitting or standing 30 minutes -trouble swallowing -an unusual or allergic reaction to alendronate, other medicines, foods, dyes, or preservatives -pregnant or trying to get  pregnant -breast-feeding How should I use this medicine? You must take this medicine exactly as directed or you will lower the amount of the medicine you absorb into your body or you may cause yourself harm. Take this medicine by mouth first thing in the morning, after you are up for the day. Do not eat or drink anything before you take your medicine. Swallow the tablet with a full glass (6 to 8 fluid ounces) of plain water. Do not take this medicine with any other drink. Do not chew or crush the tablet. After taking this medicine, do not eat breakfast, drink, or take any medicines or vitamins for at least 30 minutes. Sit or stand up for at least 30 minutes after you take this medicine; do not lie down. Do not take your medicine more often than directed. Talk to your pediatrician regarding the use of this medicine in children. Special care may be needed. Overdosage: If you think you have taken too much of this medicine contact a poison control center or emergency room at once. NOTE: This medicine is only for you. Do not share this medicine with others. What if I miss a dose? If you miss a dose, do not take it later in the day. Continue your normal schedule starting the next morning. Do not take double or extra doses. What may interact with this medicine? -aluminum hydroxide -antacids -aspirin -calcium supplements -drugs for inflammation like ibuprofen, naproxen, and others -iron supplements -magnesium supplements -vitamins with minerals This list may not describe all possible interactions. Give your health care provider a list of all the medicines, herbs, non-prescription drugs, or dietary supplements you use. Also tell them if you smoke, drink alcohol, or use illegal drugs. Some items may interact with your medicine. What should I watch for while using this medicine? Visit your doctor or health care professional for regular checks ups. It may be some time before you see benefit from this  medicine. Do not stop taking your medicine except on your doctor's advice. Your doctor or health care professional may order blood tests and other tests to see how you are doing. You should make sure you get enough calcium and vitamin D while you are taking this medicine, unless your doctor tells you not to. Discuss the foods you eat and the vitamins you take with your health care professional. Some people who take this medicine have severe bone, joint, and/or muscle pain. This medicine may also increase your risk for a broken thigh bone. Tell your doctor right away if you have pain in your upper leg  or groin. Tell your doctor if you have any pain that does not go away or that gets worse. This medicine can make you more sensitive to the sun. If you get a rash while taking this medicine, sunlight may cause the rash to get worse. Keep out of the sun. If you cannot avoid being in the sun, wear protective clothing and use sunscreen. Do not use sun lamps or tanning beds/booths. What side effects may I notice from receiving this medicine? Side effects that you should report to your doctor or health care professional as soon as possible: -allergic reactions like skin rash, itching or hives, swelling of the face, lips, or tongue -black or tarry stools -bone, muscle or joint pain -changes in vision -chest pain -heartburn or stomach pain -jaw pain, especially after dental work -pain or trouble when swallowing -redness, blistering, peeling or loosening of the skin, including inside the mouth Side effects that usually do not require medical attention (report to your doctor or health care professional if they continue or are bothersome): -changes in taste -diarrhea or constipation -eye pain or itching -headache -nausea or vomiting -stomach gas or fullness This list may not describe all possible side effects. Call your doctor for medical advice about side effects. You may report side effects to FDA at  1-800-FDA-1088. Where should I keep my medicine? Keep out of the reach of children. Store at room temperature of 15 and 30 degrees C (59 and 86 degrees F). Throw away any unused medicine after the expiration date. NOTE: This sheet is a summary. It may not cover all possible information. If you have questions about this medicine, talk to your doctor, pharmacist, or health care provider.    2016, Elsevier/Gold Standard. (2010-11-14 08:56:09) Osteoporosis Osteoporosis is the thinning and loss of density in the bones. Osteoporosis makes the bones more brittle, fragile, and likely to break (fracture). Over time, osteoporosis can cause the bones to become so weak that they fracture after a simple fall. The bones most likely to fracture are the bones in the hip, wrist, and spine. CAUSES  The exact cause is not known. RISK FACTORS Anyone can develop osteoporosis. You may be at greater risk if you have a family history of the condition or have poor nutrition. You may also have a higher risk if you are:   Female.   61 years old or older.  A smoker.  Not physically active.   White or Asian.  Slender. SIGNS AND SYMPTOMS  A fracture might be the first sign of the disease, especially if it results from a fall or injury that would not usually cause a bone to break. Other signs and symptoms include:   Low back and neck pain.  Stooped posture.  Height loss. DIAGNOSIS  To make a diagnosis, your health care provider may:  Take a medical history.  Perform a physical exam.  Order tests, such as:  A bone mineral density test.  A dual-energy X-ray absorptiometry test. TREATMENT  The goal of osteoporosis treatment is to strengthen your bones to reduce your risk of a fracture. Treatment may involve:  Making lifestyle changes, such as:  Eating a diet rich in calcium.  Doing weight-bearing and muscle-strengthening exercises.  Stopping tobacco use.  Limiting alcohol intake.  Taking  medicine to slow the process of bone loss or to increase bone density.  Monitoring your levels of calcium and vitamin D. HOME CARE INSTRUCTIONS  Include calcium and vitamin D in your diet. Calcium is important  for bone health, and vitamin D helps the body absorb calcium.  Perform weight-bearing and muscle-strengthening exercises as directed by your health care provider.  Do not use any tobacco products, including cigarettes, chewing tobacco, and electronic cigarettes. If you need help quitting, ask your health care provider.  Limit your alcohol intake.  Take medicines only as directed by your health care provider.  Keep all follow-up visits as directed by your health care provider. This is important.  Take precautions at home to lower your risk of falling, such as:  Keeping rooms well lit and clutter free.  Installing safety rails on stairs.  Using rubber mats in the bathroom and other areas that are often wet or slippery. SEEK IMMEDIATE MEDICAL CARE IF:  You fall or injure yourself.    This information is not intended to replace advice given to you by your health care provider. Make sure you discuss any questions you have with your health care provider.   Document Released: 02/25/2005 Document Revised: 06/08/2014 Document Reviewed: 10/26/2013 Elsevier Interactive Patient Education [employer].

## 2015-12-30 NOTE — Progress Notes (Signed)
Kelly Hahn 01-Jul-1950 [id number]   History:    65 y.o.  for annual gyn exam with no complaints today. She brought her lab work from her PCP Dr. Sharlett Iles which included the following normal blood tests: CBC, comprehensive metabolic panel, lipid profile, TSH, and vitamin D level, and urinalysis.  We had a lengthy discussion today in reference to her bone density study. Her last bone density study 2011 demonstrated her lowest T score was -2.8 at the AP spine. Patient had a history of a traumatic fracture of her arm in January 2013 and is currently taking calcium and vitamin D on a regular basis. Her bone density study in 2015 demonstrated her lowest T score was -3.0 the [facility] indicating osteoporosis. When the study was compared with previous study of 2011 the radiologist at documented that there was no statistically significant change from that time. Patient has refused any antiresorptive treatment and she is relying on calcium vitamin D and regular exercise despite counseling.  Her last bone density study in 2017 demonstrated the left femoral neck was -2.50 and her AP spine was -3.30 in the osteoporotic range.   Patient stated that she had benign colon polyps removed in 2012. She's never had any abnormal Pap smears in the past. She had a mammogram in 2016 in this year had Thermography which she states was normal.  Past medical history,surgical history, family history and social history were all reviewed and documented in the EPIC chart.  Gynecologic History No LMP recorded. Patient is postmenopausal. Contraception: post menopausal status Last Pap: 2015. Results were: normal Last mammogram: 2016. Results were: normal  Obstetric History OB History  Gravida Para Term Preterm AB Living  1 1 1      0  SAB TAB Ectopic Multiple Live Births               # Outcome Date GA Lbr Len/2nd Weight Sex Delivery Anes PTL Lv  1 Term                ROS: A ROS was performed and pertinent  positives and negatives are included in the history.  GENERAL: No fevers or chills. HEENT: No change in vision, no earache, sore throat or sinus congestion. NECK: No pain or stiffness. CARDIOVASCULAR: No chest pain or pressure. No palpitations. PULMONARY: No shortness of breath, cough or wheeze. GASTROINTESTINAL: No abdominal pain, nausea, vomiting or diarrhea, melena or bright red blood per rectum. GENITOURINARY: No urinary frequency, urgency, hesitancy or dysuria. MUSCULOSKELETAL: No joint or muscle pain, no back pain, no recent trauma. DERMATOLOGIC: No rash, no itching, no lesions. ENDOCRINE: No polyuria, polydipsia, no heat or cold intolerance. No recent change in weight. HEMATOLOGICAL: No anemia or easy bruising or bleeding. NEUROLOGIC: No headache, seizures, numbness, tingling or weakness. PSYCHIATRIC: No depression, no loss of interest in normal activity or change in sleep pattern.     Exam: chaperone present  BP 112/70   Ht 5' 4.5" (1.638 m)   Wt 125 lb (56.7 kg)   BMI 21.12 kg/m   Body mass index is 21.12 kg/m.  General appearance : Well developed well nourished female. No acute distress HEENT: Eyes: no retinal hemorrhage or exudates,  Neck supple, trachea midline, no carotid bruits, no thyroidmegaly Lungs: Clear to auscultation, no rhonchi or wheezes, or rib retractions  Heart: Regular rate and rhythm, no murmurs or gallops Breast:Examined in sitting and supine position were symmetrical in appearance, no palpable masses or tenderness,  no skin retraction,  no nipple inversion, no nipple discharge, no skin discoloration, no axillary or supraclavicular lymphadenopathy Abdomen: no palpable masses or tenderness, no rebound or guarding Extremities: no edema or skin discoloration or tenderness  Pelvic:  Bartholin, Urethra, Skene Glands: Within normal limits             Vagina: No gross lesions or discharge, atrophic changes  Cervix: No gross lesions or discharge  Uterus  anteverted,  normal size, shape and consistency, non-tender and mobile  Adnexa  Without masses or tenderness  Anus and perineum  normal   Rectovaginal  normal sphincter tone without palpated masses or tenderness             Hemoccult will need colonoscopy this year     Assessment/Plan:  66 y.o. female for annual exam with osteoporosis and the following treatment options were discussed with the patient:  The risk and benefits of Forteo were discussed with the patient today to include a significant risk reduction for vertebral and nontender vertebral fractures. The most common side effects that have been reported include: Dizziness, and leg cramps although generally not severe enough to warrant discontinuation in the majority of patient on this medication. The black box warning was discussed in depth, which is based on the fact that supra-pharmacological doses of Forteo did increase the risk for benign and malignant bone tumors in rats, though there has not been an observed increase incidence in humans to date, based on over a decade of clinical experience. Patient does not have any relative contraindication to Forteo, including no history of previous therapeutic radiation therapy, active neoplasms involving the skeleton or Paget's disease.  The risk and benefits of IV bisphosphonate were discussed with the patient today to include a significant risk reduction for vertebral, hip and non-vertebral fractures. Potential risk of therapy were also discussed to include a 10-15% chance for a flulike reaction which may last 2-3 days after IV bisphosphonate. We also discussed that longer reactions lasting perhaps weeks to months have been rarely reported to the FDA following IV bisphosphonate treatment. This would also require symptomatic management. Other potential risk that were discussed included the following: Regular eye irritation, approximately 1 and 1000-10,000 risk for osteonecrosis of the jaw as well as a risk for  atypical femoral fracture of approximately 1 in 5000-10,000 based on current data on oral bisphosphonate. Signs and symptoms of atypical femoral fracture were also discussed and the patient was asked to contact the office if she develops any of these symptoms.  The risk and benefits of Prolia were discussed with the patient today to include a significant risk reduction for vertebral, hip and non-vertebral fractures. Potential risk also discussed were skin advanced to include rash, pruritus, eczema and other skin reactions. The recurrence of infections and clinical studies with PROLIA, including serious infections were also discussed with the patient. In 3 year clinical trial with Prolia although six-year trial data does not show any evidence for any additional or accelerating risk of infection. Other risks that were discussed included a 1 in 1000-10,000 risk of osteonecrosis of the jaw based on current available data  The risk and benefits of oral bisphosphonate therapy were conveyed to the patient in today's visit. Benefits include a significant risk reduction of vertebral, hip and non vertebral fractures. We also discussed potential adverse effects to include precipitation or aggravation of GERD reflux disease as well as the risk of upper GI bleeding although this is reported in the literature to be extremely rare. Other rare  adverse effects that were discussed of patient taking oral bisphosphonate included a 1 in 10,000-10,000 risk for osteonecrosis of the jaw, and a risk for atypical femoral fractures of approximately 1 in 5000-10,000. Signs and symptoms of atypical femoral fracture were also discussed and the patient was informed to contact our office if any unusual symptoms were to develop.  An additional 25 minutes was spent in discussing patient's diagnosis of osteoporosis and review of her bone density studies during back to 2011 and discussing different treatment options with risks benefits and pros  and cons      Jerrye Noble MD, 3:27 PM 12/30/2015

## 2016-01-13 ENCOUNTER — Encounter: Payer: Self-pay | Admitting: Gynecology

## 2016-02-28 ENCOUNTER — Emergency Department (HOSPITAL_COMMUNITY)
Admission: EM | Admit: 2016-02-28 | Discharge: 2016-02-28 | Disposition: A | Discharge status: Home / Self Care | Payer: Medicare Other | Attending: Emergency Medicine | Admitting: Emergency Medicine

## 2016-02-28 ENCOUNTER — Emergency Department (HOSPITAL_COMMUNITY): Payer: Medicare Other

## 2016-02-28 ENCOUNTER — Encounter (HOSPITAL_COMMUNITY): Payer: Self-pay | Admitting: Emergency Medicine

## 2016-02-28 DIAGNOSIS — S8992XA Unspecified injury of left lower leg, initial encounter: Secondary | ICD-10-CM | POA: Diagnosis present

## 2016-02-28 DIAGNOSIS — Y939 Activity, unspecified: Secondary | ICD-10-CM | POA: Diagnosis not present

## 2016-02-28 DIAGNOSIS — S82032A Displaced transverse fracture of left patella, initial encounter for closed fracture: Secondary | ICD-10-CM | POA: Diagnosis not present

## 2016-02-28 DIAGNOSIS — Y92129 Unspecified place in nursing home as the place of occurrence of the external cause: Secondary | ICD-10-CM | POA: Diagnosis not present

## 2016-02-28 DIAGNOSIS — S82002A Unspecified fracture of left patella, initial encounter for closed fracture: Secondary | ICD-10-CM | POA: Diagnosis not present

## 2016-02-28 DIAGNOSIS — Y999 Unspecified external cause status: Secondary | ICD-10-CM | POA: Diagnosis not present

## 2016-02-28 DIAGNOSIS — Z7982 Long term (current) use of aspirin: Secondary | ICD-10-CM | POA: Diagnosis not present

## 2016-02-28 DIAGNOSIS — S82092A Other fracture of left patella, initial encounter for closed fracture: Secondary | ICD-10-CM | POA: Diagnosis not present

## 2016-02-28 DIAGNOSIS — W010XXA Fall on same level from slipping, tripping and stumbling without subsequent striking against object, initial encounter: Secondary | ICD-10-CM | POA: Diagnosis not present

## 2016-02-28 DIAGNOSIS — M25562 Pain in left knee: Secondary | ICD-10-CM | POA: Diagnosis not present

## 2016-02-28 DIAGNOSIS — R52 Pain, unspecified: Secondary | ICD-10-CM | POA: Diagnosis not present

## 2016-02-28 DIAGNOSIS — Z79899 Other long term (current) drug therapy: Secondary | ICD-10-CM | POA: Insufficient documentation

## 2016-02-28 MED ORDER — HYDROCODONE-ACETAMINOPHEN 5-325 MG PO TABS: 1.0000 | ORAL_TABLET | ORAL | 0 refills | Status: DC | PRN

## 2016-02-28 MED ORDER — HYDROCODONE-ACETAMINOPHEN 5-325 MG PO TABS
1.0000 | ORAL_TABLET | Freq: Once | ORAL | Status: AC
  Administered 2016-02-28: 1 via ORAL
  Filled 2016-02-28: qty 1

## 2016-02-28 NOTE — ED Notes (Signed)
Bed: WA07 Expected date:  Expected time:  Means of arrival:  Comments: EMS 65yo F knee pain / redness and swelling

## 2016-02-28 NOTE — ED Provider Notes (Signed)
[facility] DEPT Provider Note   CSN: [id number] Arrival date & time: 02/28/16  1933     History   Chief Complaint Chief Complaint  Patient presents with  . Knee Pain    HPI Kelly Hahn is a 65 y.o. female.  65 year old healthy female who presents with left knee pain after a fall. Just prior to arrival, the patient was attempting to assist her mother in the bathroom when she slipped on the wet floor, falling directly onto her left knee. She had a sudden onset of severe, constant knee pain and noticed a "divet" in the middle of her patella. She has had swelling and bruising since then and has been unable to bear weight on her L leg. She denies head injury or loss of consciousness. No injury anywhere else. She is not on any anticoagulation.   The history is provided by the patient.  Knee Pain      Past Medical History:  Diagnosis Date  . Anemia   . Endometriosis   . Fracture of arm   . Heart murmur   . High cholesterol   . Osteoporosis   . Palpitations   . Thyroid disease    Nodules    Patient Active Problem List   Diagnosis Date Noted  . Alopecia 05/03/2012  . Heartburn 02/26/2012  . Abnormal liver function test 12/08/2011  . PVC (premature ventricular contraction) 12/08/2011  . Goiter 12/08/2011  . Grief at loss of child 12/08/2011  . Hyperlipidemia 12/08/2011  . Snoring 09/17/2011  . Osteoporosis   . Endometriosis     Past Surgical History:  Procedure Laterality Date  . CESAREAN SECTION    . PELVIC LAPAROSCOPY  1989  . tummy tuck      OB History    Gravida Para Term Preterm AB Living   1 1 1      0   SAB TAB Ectopic Multiple Live Births                   Home Medications    Prior to Admission medications   Medication Sig Start Date End Date Taking? Authorizing Provider  aspirin 81 MG tablet Take 81 mg by mouth 2 (two) times a week.    Yes Historical Provider, MD  b complex vitamins tablet Take 1 tablet by mouth daily.   Yes  Historical Provider, MD  Biotin 5000 MCG CAPS Take 5,000 mcg by mouth 2 (two) times daily.    Yes Historical Provider, MD  Biotin w/ Vitamins C & E (HAIR/SKIN/NAILS PO) Take 1 tablet by mouth daily.   Yes Historical Provider, MD  cholecalciferol (VITAMIN D) 1000 UNITS tablet Take 1,000 Units by mouth daily.    Yes Historical Provider, MD  Evening Primrose Oil 1000 MG CAPS Take 1,300 mg by mouth 2 (two) times daily.    Yes Historical Provider, MD  ibuprofen (ADVIL,MOTRIN) 200 MG tablet Take 400 mg by mouth every 6 (six) hours as needed for moderate pain.   Yes Historical Provider, MD  OVER THE COUNTER MEDICATION Take by mouth daily. shaklee multivitamin --strip of seven pills that you take once a day.   Yes Historical Provider, MD  OVER THE COUNTER MEDICATION Take 1 tablet by mouth daily. osteomatrix- calcium supplement    Yes Historical Provider, MD  OVER THE COUNTER MEDICATION Take 1 tablet by mouth daily. Nutriferon   Yes Historical Provider, MD  HYDROcodone-acetaminophen (NORCO/VICODIN) 5-325 MG tablet Take 1-2 tablets by mouth every 4 (four) hours as  needed. 02/28/16   Sharlett Iles, MD    Family History Family History  Problem Relation Age of Onset  . Hypertension Mother   . Diabetes Mother   . Osteoarthritis Mother   . Heart disease Mother   . Heart disease Father     CHF  . Alzheimer's disease Father   . Heart disease Maternal Grandfather   . Cancer Maternal Aunt     colon  . Breast cancer Paternal Aunt     Social History Social History  Substance Use Topics  . Smoking status: Never Smoker  . Smokeless tobacco: Never Used  . Alcohol use 1.8 oz/week    3 Glasses of wine per week     Comment: per week     Allergies   Tramadol   Review of Systems Review of Systems 10 Systems reviewed and are negative for acute change except as noted in the HPI.   Physical Exam Updated Vital Signs BP 130/80 (BP Location: Right Arm)   Pulse 65   Temp 98.3 F (36.8 C)  (Oral)   Resp 17   Ht 5\' 5"  (1.651 m)   Wt 122 lb (55.3 kg)   SpO2 100%   BMI 20.30 kg/m   Physical Exam  Constitutional: She is oriented to person, place, and time. She appears well-developed and well-nourished. No distress.  HENT:  Head: Normocephalic and atraumatic.  Eyes: Conjunctivae are normal.  Neck: Neck supple.  Cardiovascular: Intact distal pulses.   Musculoskeletal: She exhibits edema and tenderness.  Edema of L anterior knee over patella w/ closed patella deformity, portion of patella lying supralateral to joint; unable to extend leg at knee due to pain No ankle, tib/fib, or proximal leg tenderness  Neurological: She is alert and oriented to person, place, and time.  Normal sensation L foot  Skin: Skin is warm and dry.  Psychiatric: She has a normal mood and affect. Judgment normal.  Nursing note and vitals reviewed.    ED Treatments / Results  Labs (all labs ordered are listed, but only abnormal results are displayed) Labs Reviewed - No data to display  EKG  EKG Interpretation None       Radiology Dg Knee Complete 4 Views Left  Result Date: 02/28/2016 CLINICAL DATA:  Slip and fall injury. Deformity, swelling of the anterior knee. EXAM: LEFT KNEE - COMPLETE 4+ VIEW COMPARISON:  None. FINDINGS: Transverse mildly comminuted fracture through the body of the patella with distraction of the fracture fragments and angulation of the distal fracture fragment. This results in approximately 6 mm cortical step-off at the articular surface. There is associated soft tissue swelling over the prepatellar space. No significant effusion. Mild degenerative changes in the left knee with mild narrowing and osteophyte formation of the medial compartment. No additional fractures or focal bone lesions identified. IMPRESSION: Transverse fracture through the body of the patella with distraction of fracture fragments. Electronically Signed   By: Lucienne Capers M.D.   On: 02/28/2016 21:09     Procedures Procedures (including critical care time)  Medications Ordered in ED Medications  HYDROcodone-acetaminophen (NORCO/VICODIN) 5-325 MG per tablet 1 tablet (1 tablet Oral Given 02/28/16 2220)     Initial Impression / Assessment and Plan / ED Course  I have reviewed the triage vital signs and the nursing notes.  Pertinent imaging results that were available during my care of the patient were reviewed by me and considered in my medical decision making (see chart for details).  Clinical Course  Pt w/ L knee injury after falling onto knee. Neurovascularly intact distally. Plain films show Comminuted transverse fracture of patella with distraction of fragments. Discussed injury with [facility] orthopedics, Dr. Tonita Cong, who agreed with plan to d/c in knee immobilizer with crutches and nonweightbearing. Patient to follow-up next week in clinic to discuss further management. Provided with Norco and instructions on use. Return precautions reviewed and patient discharged in satisfactory condition.  Final Clinical Impressions(s) / ED Diagnoses   Final diagnoses:  Patella fracture, left, closed, initial encounter    New Prescriptions Discharge Medication List as of 02/28/2016 10:15 PM    START taking these medications   Details  HYDROcodone-acetaminophen (NORCO/VICODIN) 5-325 MG tablet Take 1-2 tablets by mouth every 4 (four) hours as needed., Starting Fri 02/28/2016, Print         Sharlett Iles, MD 02/29/16 [phone]

## 2016-02-28 NOTE — ED Triage Notes (Signed)
Per EMS: pt reports a fall on her left knee at her nursing facility. Pt c/o of not being able to put pressure on the L knee.  EMS has put ice on the knee which made it feel better  BP 130/84 HR 64 RR 14

## 2016-03-02 ENCOUNTER — Encounter (HOSPITAL_COMMUNITY): Payer: Self-pay | Admitting: *Deleted

## 2016-03-02 DIAGNOSIS — S82092A Other fracture of left patella, initial encounter for closed fracture: Secondary | ICD-10-CM | POA: Diagnosis not present

## 2016-03-02 DIAGNOSIS — M25562 Pain in left knee: Secondary | ICD-10-CM | POA: Diagnosis not present

## 2016-03-02 NOTE — H&P (Signed)
Kelly Hahn is an 65 y.o. female.    Chief Complaint:   Left patella fracture  Procedure:    ORIF left patella fracture   HPI:    65 year old healthy female who presents with left knee pain after a fall. She was seen previously in her PCP's office after sustaining a fall. On February 28, 2016, the patient was attempting to assist her mother in the bathroom when she slipped on the wet floor, falling directly onto her left knee. She had a sudden onset of severe, constant knee pain and noticed a "divet" in the middle of her patella. She has had swelling and bruising since then and has been unable to bear weight on her L leg.  She was referred to our clinic where x-rays revealed a horizontal fracture through the middle of the patella.  We have discussed various options and understands that surgery is needed to repair the fractured patella.  Risks, benefits and expectations were discussed with the patient.  Risks including but not limited to the risk of anesthesia, blood clots, nerve damage, blood vessel damage, failure of the prosthesis, infection and up to and including death.  Patient understand the risks, benefits and expectations and wishes to proceed with surgery. The patient and situation was discussed with Dr. Alvan Hahn who will meet the patient tomorrow at the hospital prior to surgery to repair the patella.   PCP: Kelly Lopes, MD  D/C Plans:      Home  Post-op Meds:       No Rx given  Tranexamic Acid:      To be given - IV    Decadron:      Is to be given  FYI:     ASA  Tramadol and APAP  (in office patient states that she can use the tramadol - unable to tolerate Norco or stronger, per patient)   PMH: Past Medical History:  Diagnosis Date  . Anemia   . Endometriosis   . Fall   . Fracture of arm   . Heart murmur   . High cholesterol   . Osteoporosis   . Palpitations   . Thyroid disease    Nodules    PSH: Past Surgical History:  Procedure Laterality Date  .  CESAREAN SECTION    . PELVIC LAPAROSCOPY  1989  . tummy tuck      Social History:  reports that she has never smoked. She has never used smokeless tobacco. She reports that she drinks about 1.8 oz of alcohol per week . She reports that she does not use drugs.  Allergies:  Allergies  Allergen Reactions  . Hydrocodone Other (See Comments)    Makes her feel like she's not breathing  . Tramadol Other (See Comments)    "head pounding, made me loopy, delusional, too strong for me"     Medications: No current facility-administered medications for this encounter.    Current Outpatient Prescriptions  Medication Sig Dispense Refill  . acetaminophen (TYLENOL) 650 MG suppository Place 650 mg rectally every 4 (four) hours as needed for moderate pain.    Marland Kitchen aspirin 81 MG tablet Take 81 mg by mouth daily.     . Biotin 5000 MCG CAPS Take 5,000 mcg by mouth 2 (two) times daily.     . Biotin w/ Vitamins C & E (HAIR/SKIN/NAILS PO) Take 1 tablet by mouth daily.    . cholecalciferol (VITAMIN D) 1000 UNITS tablet Take 1,000 Units by mouth daily.     Marland Kitchen  Cobalamine Combinations (VITAMIN B12-FOLIC ACID PO) Take 1 tablet by mouth daily.    . Evening Primrose Oil 1000 MG CAPS Take 1,300 mg by mouth 2 (two) times daily.     Marland Kitchen ibuprofen (ADVIL,MOTRIN) 200 MG tablet Take 200 mg by mouth every 6 (six) hours as needed for moderate pain.     Marland Kitchen OVER THE COUNTER MEDICATION Take 1 tablet by mouth daily. shaklee multivitamin --strip of seven pills that you take once a day.    Marland Kitchen OVER THE COUNTER MEDICATION Take 2 tablets by mouth 2 (two) times daily. Shaklee osteomatrix- calcium supplement    . OVER THE COUNTER MEDICATION Take 1 tablet by mouth daily. Nutriferon  Immune system Supplement    . HYDROcodone-acetaminophen (NORCO/VICODIN) 5-325 MG tablet Take 1-2 tablets by mouth every 4 (four) hours as needed. (Patient not taking: Reported on 03/02/2016) 12 tablet 0      Review of Systems  Constitutional: Negative.     HENT: Negative.   Eyes: Negative.   Respiratory: Negative.   Cardiovascular: Negative.   Gastrointestinal: Negative.   Genitourinary: Negative.   Musculoskeletal: Positive for joint pain.  Skin: Negative.   Neurological: Negative.   Endo/Heme/Allergies: Negative.   Psychiatric/Behavioral: Negative.        Physical Exam  Constitutional: She is oriented to person, place, and time. She appears well-developed.  HENT:  Head: Normocephalic.  Eyes: Pupils are equal, round, and reactive to light.  Neck: Neck supple. No JVD present. No tracheal deviation present. No thyromegaly present.  Cardiovascular: Normal rate, regular rhythm and intact distal pulses.   Murmur heard. Respiratory: Effort normal and breath sounds normal. No respiratory distress. She has no wheezes.  GI: Soft. There is no tenderness. There is no guarding.  Musculoskeletal:       Left knee: She exhibits decreased range of motion, swelling, effusion, ecchymosis, deformity, abnormal alignment and bony tenderness. She exhibits no laceration and no erythema. Tenderness found.  Lymphadenopathy:    She has no cervical adenopathy.  Neurological: She is alert and oriented to person, place, and time.  Skin: Skin is warm and dry.  Psychiatric: She has a normal mood and affect.       Assessment/Plan Assessment:     Left patella fracture   Plan: Patient will undergo a ORIF left patella fracture  on 03/03/2016 per Dr. Alvan Hahn at [facility]. Risks benefits and expectations were discussed with the patient. Patient understand risks, benefits and expectations and wishes to proceed.   Kelly Pugh Yaron Grasse   PA-C  03/02/2016, 8:59 PM

## 2016-03-02 NOTE — Progress Notes (Signed)
Please place surgical orders in epic. Thanks.

## 2016-03-03 ENCOUNTER — Ambulatory Visit (HOSPITAL_COMMUNITY): Payer: Medicare Other

## 2016-03-03 ENCOUNTER — Encounter (HOSPITAL_COMMUNITY): Payer: Self-pay | Admitting: *Deleted

## 2016-03-03 ENCOUNTER — Inpatient Hospital Stay (HOSPITAL_COMMUNITY)
Admission: RE | Admit: 2016-03-03 | Discharge: 2016-03-05 | DRG: 516 | Disposition: A | Discharge status: Home Health | Payer: Medicare Other | Source: Ambulatory Visit | Attending: Orthopedic Surgery | Admitting: Orthopedic Surgery

## 2016-03-03 ENCOUNTER — Inpatient Hospital Stay (HOSPITAL_COMMUNITY): Payer: Medicare Other

## 2016-03-03 ENCOUNTER — Ambulatory Visit (HOSPITAL_COMMUNITY): Payer: Medicare Other | Admitting: Certified Registered Nurse Anesthetist

## 2016-03-03 ENCOUNTER — Encounter (HOSPITAL_COMMUNITY)
Admission: RE | Disposition: A | Discharge status: Home / Self Care | Payer: Self-pay | Source: Ambulatory Visit | Attending: Orthopedic Surgery

## 2016-03-03 DIAGNOSIS — S82042A Displaced comminuted fracture of left patella, initial encounter for closed fracture: Principal | ICD-10-CM | POA: Diagnosis present

## 2016-03-03 DIAGNOSIS — Z885 Allergy status to narcotic agent status: Secondary | ICD-10-CM

## 2016-03-03 DIAGNOSIS — Z79899 Other long term (current) drug therapy: Secondary | ICD-10-CM

## 2016-03-03 DIAGNOSIS — M25062 Hemarthrosis, left knee: Secondary | ICD-10-CM | POA: Diagnosis present

## 2016-03-03 DIAGNOSIS — M810 Age-related osteoporosis without current pathological fracture: Secondary | ICD-10-CM | POA: Diagnosis present

## 2016-03-03 DIAGNOSIS — Y9289 Other specified places as the place of occurrence of the external cause: Secondary | ICD-10-CM

## 2016-03-03 DIAGNOSIS — Z888 Allergy status to other drugs, medicaments and biological substances status: Secondary | ICD-10-CM | POA: Diagnosis not present

## 2016-03-03 DIAGNOSIS — Z9889 Other specified postprocedural states: Secondary | ICD-10-CM

## 2016-03-03 DIAGNOSIS — Z419 Encounter for procedure for purposes other than remedying health state, unspecified: Secondary | ICD-10-CM

## 2016-03-03 DIAGNOSIS — S82002A Unspecified fracture of left patella, initial encounter for closed fracture: Secondary | ICD-10-CM | POA: Diagnosis not present

## 2016-03-03 DIAGNOSIS — Z7982 Long term (current) use of aspirin: Secondary | ICD-10-CM

## 2016-03-03 DIAGNOSIS — W010XXA Fall on same level from slipping, tripping and stumbling without subsequent striking against object, initial encounter: Secondary | ICD-10-CM | POA: Diagnosis present

## 2016-03-03 HISTORY — PX: ORIF PATELLA: SHX5033

## 2016-03-03 HISTORY — DX: Unspecified fall, initial encounter: W19.XXXA

## 2016-03-03 LAB — CBC
HCT: 38.3 % (ref 36.0–46.0)
Hemoglobin: 12.3 g/dL (ref 12.0–15.0)
MCH: 27.3 pg (ref 26.0–34.0)
MCHC: 32.1 g/dL (ref 30.0–36.0)
MCV: 84.9 fL (ref 78.0–100.0)
Platelets: 294 10*3/uL (ref 150–400)
RBC: 4.51 MIL/uL (ref 3.87–5.11)
RDW: 14 % (ref 11.5–15.5)
WBC: 5.7 10*3/uL (ref 4.0–10.5)

## 2016-03-03 LAB — BASIC METABOLIC PANEL
Anion gap: 8 (ref 5–15)
BUN: 13 mg/dL (ref 6–20)
CO2: 26 mmol/L (ref 22–32)
Calcium: 9.3 mg/dL (ref 8.9–10.3)
Chloride: 108 mmol/L (ref 101–111)
Creatinine, Ser: 0.56 mg/dL (ref 0.44–1.00)
GFR calc Af Amer: >60 mL/min (ref >60)
GFR calc non Af Amer: >60 mL/min (ref >60)
Glucose, Bld: 96 mg/dL (ref 65–99)
Potassium: 3.6 mmol/L (ref 3.5–5.1)
Sodium: 142 mmol/L (ref 135–145)

## 2016-03-03 SURGERY — OPEN REDUCTION INTERNAL FIXATION (ORIF) PATELLA
Anesthesia: Spinal | Laterality: Left

## 2016-03-03 MED ORDER — POLYETHYLENE GLYCOL 3350 17 G PO PACK
17.0000 g | PACK | Freq: Two times a day (BID) | ORAL | Status: DC
  Administered 2016-03-04 – 2016-03-05 (×3): 17 g via ORAL
  Filled 2016-03-03 (×3): qty 1

## 2016-03-03 MED ORDER — CHLORHEXIDINE GLUCONATE 4 % EX LIQD: 60.0000 mL | Freq: Once | CUTANEOUS | Status: DC

## 2016-03-03 MED ORDER — DEXAMETHASONE SODIUM PHOSPHATE 10 MG/ML IJ SOLN
10.0000 mg | Freq: Once | INTRAMUSCULAR | Status: AC
  Administered 2016-03-04: 10 mg via INTRAVENOUS
  Filled 2016-03-03: qty 1

## 2016-03-03 MED ORDER — TRANEXAMIC ACID 1000 MG/10ML IV SOLN
1000.0000 mg | INTRAVENOUS | Status: DC
  Filled 2016-03-03: qty 10

## 2016-03-03 MED ORDER — PHENYLEPHRINE 40 MCG/ML (10ML) SYRINGE FOR IV PUSH (FOR BLOOD PRESSURE SUPPORT)
PREFILLED_SYRINGE | INTRAVENOUS | Status: DC | PRN
  Administered 2016-03-03: 40 ug via INTRAVENOUS

## 2016-03-03 MED ORDER — LACTATED RINGERS IV SOLN
INTRAVENOUS | Status: DC
  Administered 2016-03-03 (×2): via INTRAVENOUS

## 2016-03-03 MED ORDER — 0.9 % SODIUM CHLORIDE (POUR BTL) OPTIME
TOPICAL | Status: DC | PRN
  Administered 2016-03-03: 1000 mL

## 2016-03-03 MED ORDER — DIPHENHYDRAMINE HCL 25 MG PO CAPS: 25.0000 mg | ORAL_CAPSULE | Freq: Four times a day (QID) | ORAL | Status: DC | PRN

## 2016-03-03 MED ORDER — METOCLOPRAMIDE HCL 5 MG PO TABS: 5.0000 mg | ORAL_TABLET | Freq: Three times a day (TID) | ORAL | Status: DC | PRN

## 2016-03-03 MED ORDER — MIDAZOLAM HCL 5 MG/5ML IJ SOLN
INTRAMUSCULAR | Status: DC | PRN
  Administered 2016-03-03 (×2): 1 mg via INTRAVENOUS

## 2016-03-03 MED ORDER — PHENYLEPHRINE 40 MCG/ML (10ML) SYRINGE FOR IV PUSH (FOR BLOOD PRESSURE SUPPORT)
PREFILLED_SYRINGE | INTRAVENOUS | Status: AC
  Filled 2016-03-03: qty 10

## 2016-03-03 MED ORDER — PROPOFOL 10 MG/ML IV BOLUS
INTRAVENOUS | Status: AC
  Filled 2016-03-03: qty 20

## 2016-03-03 MED ORDER — PROPOFOL 10 MG/ML IV BOLUS
INTRAVENOUS | Status: DC | PRN
  Administered 2016-03-03 (×3): 20 mg via INTRAVENOUS

## 2016-03-03 MED ORDER — CEFAZOLIN SODIUM-DEXTROSE 2-4 GM/100ML-% IV SOLN
2.0000 g | INTRAVENOUS | Status: AC
  Administered 2016-03-03: 2 g via INTRAVENOUS

## 2016-03-03 MED ORDER — ONDANSETRON HCL 4 MG/2ML IJ SOLN
4.0000 mg | Freq: Four times a day (QID) | INTRAMUSCULAR | Status: DC | PRN
  Administered 2016-03-04: 4 mg via INTRAVENOUS
  Filled 2016-03-03: qty 2

## 2016-03-03 MED ORDER — MIDAZOLAM HCL 2 MG/2ML IJ SOLN
INTRAMUSCULAR | Status: AC
  Filled 2016-03-03: qty 2

## 2016-03-03 MED ORDER — ONDANSETRON HCL 4 MG/2ML IJ SOLN
INTRAMUSCULAR | Status: AC
  Filled 2016-03-03: qty 2

## 2016-03-03 MED ORDER — ASPIRIN 81 MG PO CHEW
81.0000 mg | CHEWABLE_TABLET | Freq: Two times a day (BID) | ORAL | Status: DC
  Administered 2016-03-04 – 2016-03-05 (×3): 81 mg via ORAL
  Filled 2016-03-03 (×3): qty 1

## 2016-03-03 MED ORDER — FENTANYL CITRATE (PF) 100 MCG/2ML IJ SOLN
25.0000 ug | INTRAMUSCULAR | Status: DC | PRN
  Administered 2016-03-03 (×2): 25 ug via INTRAVENOUS
  Administered 2016-03-03: 50 ug via INTRAVENOUS

## 2016-03-03 MED ORDER — ALUM & MAG HYDROXIDE-SIMETH 200-200-20 MG/5ML PO SUSP: 30.0000 mL | ORAL | Status: DC | PRN

## 2016-03-03 MED ORDER — FERROUS SULFATE 325 (65 FE) MG PO TABS
325.0000 mg | ORAL_TABLET | Freq: Three times a day (TID) | ORAL | Status: DC
  Administered 2016-03-04 – 2016-03-05 (×4): 325 mg via ORAL
  Filled 2016-03-03 (×4): qty 1

## 2016-03-03 MED ORDER — FENTANYL CITRATE (PF) 100 MCG/2ML IJ SOLN
INTRAMUSCULAR | Status: AC
  Administered 2016-03-03: 25 ug via INTRAVENOUS
  Filled 2016-03-03: qty 2

## 2016-03-03 MED ORDER — ONDANSETRON HCL 4 MG/2ML IJ SOLN
INTRAMUSCULAR | Status: DC | PRN
  Administered 2016-03-03: 4 mg via INTRAVENOUS

## 2016-03-03 MED ORDER — MAGNESIUM CITRATE PO SOLN: 1.0000 | Freq: Once | ORAL | Status: DC | PRN

## 2016-03-03 MED ORDER — CEFAZOLIN SODIUM-DEXTROSE 2-4 GM/100ML-% IV SOLN
2.0000 g | Freq: Four times a day (QID) | INTRAVENOUS | Status: AC
  Administered 2016-03-03 – 2016-03-04 (×2): 2 g via INTRAVENOUS
  Filled 2016-03-03 (×2): qty 100

## 2016-03-03 MED ORDER — HYDROMORPHONE HCL 1 MG/ML IJ SOLN
INTRAMUSCULAR | Status: AC
  Filled 2016-03-03: qty 1

## 2016-03-03 MED ORDER — METHOCARBAMOL 500 MG PO TABS
500.0000 mg | ORAL_TABLET | Freq: Four times a day (QID) | ORAL | Status: DC | PRN
  Administered 2016-03-04 – 2016-03-05 (×2): 500 mg via ORAL
  Filled 2016-03-03 (×2): qty 1

## 2016-03-03 MED ORDER — DEXTROSE 5 % IV SOLN
500.0000 mg | Freq: Four times a day (QID) | INTRAVENOUS | Status: DC | PRN
  Administered 2016-03-03: 500 mg via INTRAVENOUS
  Filled 2016-03-03: qty 5
  Filled 2016-03-03: qty 550

## 2016-03-03 MED ORDER — PROPOFOL 500 MG/50ML IV EMUL
INTRAVENOUS | Status: DC | PRN
  Administered 2016-03-03: 75 ug/kg/min via INTRAVENOUS

## 2016-03-03 MED ORDER — DEXTROSE 5 % IV SOLN
INTRAVENOUS | Status: DC | PRN
  Administered 2016-03-03: 10 ug/min via INTRAVENOUS

## 2016-03-03 MED ORDER — ACETAMINOPHEN 500 MG PO TABS
1000.0000 mg | ORAL_TABLET | Freq: Three times a day (TID) | ORAL | Status: DC
  Administered 2016-03-03 – 2016-03-05 (×5): 1000 mg via ORAL
  Filled 2016-03-03 (×5): qty 2

## 2016-03-03 MED ORDER — DEXAMETHASONE SODIUM PHOSPHATE 10 MG/ML IJ SOLN
INTRAMUSCULAR | Status: AC
  Filled 2016-03-03: qty 1

## 2016-03-03 MED ORDER — MENTHOL 3 MG MT LOZG: 1.0000 | LOZENGE | OROMUCOSAL | Status: DC | PRN

## 2016-03-03 MED ORDER — METOCLOPRAMIDE HCL 5 MG/ML IJ SOLN: 5.0000 mg | Freq: Three times a day (TID) | INTRAMUSCULAR | Status: DC | PRN

## 2016-03-03 MED ORDER — SODIUM CHLORIDE 0.9 % IV SOLN
INTRAVENOUS | Status: DC
  Administered 2016-03-03: 23:00:00 via INTRAVENOUS
  Filled 2016-03-03 (×5): qty 1000

## 2016-03-03 MED ORDER — FENTANYL CITRATE (PF) 100 MCG/2ML IJ SOLN
INTRAMUSCULAR | Status: DC | PRN
  Administered 2016-03-03 (×2): 50 ug via INTRAVENOUS

## 2016-03-03 MED ORDER — HYDROMORPHONE HCL 1 MG/ML IJ SOLN
0.5000 mg | INTRAMUSCULAR | Status: DC | PRN
  Administered 2016-03-03: 1 mg via INTRAVENOUS
  Administered 2016-03-03: 0.5 mg via INTRAVENOUS
  Administered 2016-03-04: 1 mg via INTRAVENOUS
  Filled 2016-03-03 (×2): qty 1

## 2016-03-03 MED ORDER — PHENOL 1.4 % MT LIQD
1.0000 | OROMUCOSAL | Status: DC | PRN
  Filled 2016-03-03: qty 177

## 2016-03-03 MED ORDER — TRAMADOL HCL 50 MG PO TABS: 50.0000 mg | ORAL_TABLET | Freq: Four times a day (QID) | ORAL | Status: DC | PRN

## 2016-03-03 MED ORDER — CELECOXIB 200 MG PO CAPS
200.0000 mg | ORAL_CAPSULE | Freq: Two times a day (BID) | ORAL | Status: DC
  Administered 2016-03-03 – 2016-03-05 (×4): 200 mg via ORAL
  Filled 2016-03-03 (×4): qty 1

## 2016-03-03 MED ORDER — CEFAZOLIN SODIUM-DEXTROSE 2-4 GM/100ML-% IV SOLN
INTRAVENOUS | Status: AC
  Filled 2016-03-03: qty 100

## 2016-03-03 MED ORDER — DEXAMETHASONE SODIUM PHOSPHATE 10 MG/ML IJ SOLN
10.0000 mg | Freq: Once | INTRAMUSCULAR | Status: AC
  Administered 2016-03-03: 10 mg via INTRAVENOUS

## 2016-03-03 MED ORDER — BISACODYL 10 MG RE SUPP: 10.0000 mg | Freq: Every day | RECTAL | Status: DC | PRN

## 2016-03-03 MED ORDER — BUPIVACAINE IN DEXTROSE 0.75-8.25 % IT SOLN
INTRATHECAL | Status: DC | PRN
  Administered 2016-03-03: 1.5 mL via INTRATHECAL

## 2016-03-03 MED ORDER — PROMETHAZINE HCL 25 MG/ML IJ SOLN: 6.2500 mg | INTRAMUSCULAR | Status: DC | PRN

## 2016-03-03 MED ORDER — FENTANYL CITRATE (PF) 100 MCG/2ML IJ SOLN
INTRAMUSCULAR | Status: AC
  Filled 2016-03-03: qty 2

## 2016-03-03 MED ORDER — DOCUSATE SODIUM 100 MG PO CAPS
100.0000 mg | ORAL_CAPSULE | Freq: Two times a day (BID) | ORAL | Status: DC
  Administered 2016-03-03 – 2016-03-05 (×4): 100 mg via ORAL
  Filled 2016-03-03 (×4): qty 1

## 2016-03-03 MED ORDER — ONDANSETRON HCL 4 MG PO TABS: 4.0000 mg | ORAL_TABLET | Freq: Four times a day (QID) | ORAL | Status: DC | PRN

## 2016-03-03 SURGICAL SUPPLY — 51 items
BAG ZIPLOCK 12X15 (MISCELLANEOUS) ×2 IMPLANT
BANDAGE ACE 4X5 VEL STRL LF (GAUZE/BANDAGES/DRESSINGS) ×2 IMPLANT
BNDG COHESIVE 6X5 TAN NS LF (GAUZE/BANDAGES/DRESSINGS) ×2 IMPLANT
CLOTH BEACON ORANGE TIMEOUT ST (SAFETY) ×2 IMPLANT
CUFF TOURN SGL QUICK 34 (TOURNIQUET CUFF) ×1
CUFF TOURN SGL QUICK 44 (TOURNIQUET CUFF) IMPLANT
CUFF TRNQT CYL 34X4X40X1 (TOURNIQUET CUFF) ×1 IMPLANT
DECANTER SPIKE VIAL GLASS SM (MISCELLANEOUS) IMPLANT
DERMABOND ADVANCED (GAUZE/BANDAGES/DRESSINGS) ×1
DERMABOND ADVANCED .7 DNX12 (GAUZE/BANDAGES/DRESSINGS) ×1 IMPLANT
DRAPE U-SHAPE 47X51 STRL (DRAPES) ×2 IMPLANT
DRESSING AQUACEL AG SP 3.5X10 (GAUZE/BANDAGES/DRESSINGS) ×1 IMPLANT
DRSG AQUACEL AG ADV 3.5X10 (GAUZE/BANDAGES/DRESSINGS) ×2 IMPLANT
DRSG AQUACEL AG SP 3.5X10 (GAUZE/BANDAGES/DRESSINGS) ×2
DURAPREP 26ML APPLICATOR (WOUND CARE) ×4 IMPLANT
ELECT REM PT RETURN 9FT ADLT (ELECTROSURGICAL) ×2
ELECTRODE REM PT RTRN 9FT ADLT (ELECTROSURGICAL) ×1 IMPLANT
GLOVE BIOGEL M 7.0 STRL (GLOVE) ×6 IMPLANT
GLOVE BIOGEL PI IND STRL 7.5 (GLOVE) ×1 IMPLANT
GLOVE BIOGEL PI IND STRL 8.5 (GLOVE) ×1 IMPLANT
GLOVE BIOGEL PI INDICATOR 7.5 (GLOVE) ×1
GLOVE BIOGEL PI INDICATOR 8.5 (GLOVE) ×1
GLOVE ECLIPSE 8.0 STRL XLNG CF (GLOVE) IMPLANT
GLOVE ORTHO TXT STRL SZ7.5 (GLOVE) ×4 IMPLANT
GLOVE SURG ORTHO 8.0 STRL STRW (GLOVE) ×2 IMPLANT
GOWN STRL REUS W/TWL LRG LVL3 (GOWN DISPOSABLE) ×2 IMPLANT
GOWN STRL REUS W/TWL XL LVL3 (GOWN DISPOSABLE) ×4 IMPLANT
IMMOBILIZER KNEE 20 (SOFTGOODS) ×2
IMMOBILIZER KNEE 20 THIGH 36 (SOFTGOODS) ×1 IMPLANT
K-WIRE 1.2X150 DISP (WIRE) ×4 IMPLANT
MANIFOLD NEPTUNE II (INSTRUMENTS) ×2 IMPLANT
NS IRRIG 1000ML POUR BTL (IV SOLUTION) ×2 IMPLANT
PACK TOTAL KNEE CUSTOM (KITS) ×2 IMPLANT
PASSER SUT SWANSON 36MM LOOP (INSTRUMENTS) ×2 IMPLANT
POSITIONER SURGICAL ARM (MISCELLANEOUS) ×2 IMPLANT
SCREW CANN L THRD/16 4.0 (Screw) ×2 IMPLANT
SCREW CANN L THRD/26 4.0 (Screw) ×2 IMPLANT
STAPLER VISISTAT (STAPLE) IMPLANT
SUT BONE WAX W31G (SUTURE) ×2 IMPLANT
SUT ETHIBOND NAB CT1 #1 30IN (SUTURE) ×4 IMPLANT
SUT ETHILON 4 0 PS 2 18 (SUTURE) ×2 IMPLANT
SUT FIBERWIRE #2 38 T-5 BLUE (SUTURE) ×4
SUT FIBERWIRE #5 38 BLUE (WIRE) ×4 IMPLANT
SUT VIC AB 0 CT1 27 (SUTURE) ×2
SUT VIC AB 0 CT1 27XBRD ANTBC (SUTURE) ×2 IMPLANT
SUT VIC AB 1 CT1 27 (SUTURE) ×2
SUT VIC AB 1 CT1 27XBRD ANTBC (SUTURE) ×2 IMPLANT
SUT VIC AB 2-0 CT1 27 (SUTURE) ×2
SUT VIC AB 2-0 CT1 TAPERPNT 27 (SUTURE) ×2 IMPLANT
SUTURE FIBERWR #2 38 T-5 BLUE (SUTURE) ×2 IMPLANT
WATER STERILE IRR 1500ML POUR (IV SOLUTION) ×2 IMPLANT

## 2016-03-03 NOTE — Anesthesia Preprocedure Evaluation (Addendum)
Anesthesia Evaluation  Patient identified by MRN, date of birth, ID band Patient awake    Reviewed: Allergy & Precautions, NPO status , Patient's Chart, lab work & pertinent test results  Airway Mallampati: II  TM Distance: >3 FB Neck ROM: Full    Dental no notable dental hx.    Pulmonary neg pulmonary ROS,    Pulmonary exam normal breath sounds clear to auscultation       Cardiovascular negative cardio ROS Normal cardiovascular exam+ Valvular Problems/Murmurs  Rhythm:Regular Rate:Normal     Neuro/Psych negative neurological ROS  negative psych ROS   GI/Hepatic negative GI ROS, Neg liver ROS,   Endo/Other  negative endocrine ROS  Renal/GU negative Renal ROS  negative genitourinary   Musculoskeletal negative musculoskeletal ROS (+)   Abdominal   Peds negative pediatric ROS (+)  Hematology  (+) anemia ,   Anesthesia Other Findings   Reproductive/Obstetrics negative OB ROS                             Anesthesia Physical Anesthesia Plan  ASA: II  Anesthesia Plan: Spinal   Post-op Pain Management:    Induction: Intravenous  Airway Management Planned: Natural Airway  Additional Equipment:   Intra-op Plan:   Post-operative Plan:   Informed Consent: I have reviewed the patients History and Physical, chart, labs and discussed the procedure including the risks, benefits and alternatives for the proposed anesthesia with the patient or authorized representative who has indicated his/her understanding and acceptance.   Dental advisory given  Plan Discussed with: CRNA  Anesthesia Plan Comments: (Discussed risks and benefits of and differences between spinal and general. Discussed risks of spinal including headache, backache, failure, bleeding and hematoma, infection, and nerve damage. Patient consents to spinal. Questions answered. Platelet count acceptable and no history of excessive  bleeding.)      Anesthesia Quick Evaluation

## 2016-03-03 NOTE — Interval H&P Note (Signed)
History and Physical Interval Note:  03/03/2016 3:47 PM  Kelly Hahn  has presented today for surgery, with the diagnosis of LEFT KNEE PATELLA FRACTURE  The various methods of treatment have been discussed with the patient and family. After consideration of risks, benefits and other options for treatment, the patient has consented to  Procedure(s): OPEN REDUCTION INTERNAL (ORIF) FIXATION PATELLA (Left) as a surgical intervention .  The patient's history has been reviewed, patient examined, no change in status, stable for surgery.  I have reviewed the patient's chart and labs.  Questions were answered to the patient's satisfaction.     [name]

## 2016-03-03 NOTE — Transfer of Care (Signed)
Immediate Anesthesia Transfer of Care Note  Patient: Kelly Hahn  Procedure(s) Performed: Procedure(s): OPEN REDUCTION INTERNAL (ORIF) FIXATION PATELLA (Left)  Patient Location: PACU  Anesthesia Type:Regional  Level of Consciousness: awake, alert  and oriented  Airway & Oxygen Therapy: Patient Spontanous Breathing and Patient connected to face mask oxygen  Post-op Assessment: Report given to RN and Post -op Vital signs reviewed and stable  Post vital signs: Reviewed and stable  Last Vitals:  Vitals:   03/03/16 1423  BP: 130/80  Pulse: 86  Resp: 16  Temp: 37.3 C    Last Pain: There were no vitals filed for this visit.    Patients Stated Pain Goal: 4 ([id number])  Complications: No apparent anesthesia complications

## 2016-03-03 NOTE — Anesthesia Procedure Notes (Signed)
Spinal  Patient location during procedure: OR End time: 03/03/2016 4:53 PM Staffing Anesthesiologist: Franne Grip Performed: anesthesiologist  Preanesthetic Checklist Completed: patient identified, site marked, surgical consent, pre-op evaluation, timeout performed, IV checked, risks and benefits discussed and monitors and equipment checked Spinal Block Patient position: sitting Prep: Betadine Patient monitoring: heart rate, continuous pulse ox and blood pressure Location: L3-4 Injection technique: single-shot Needle Needle type: Spinocan  Needle gauge: 22 G Needle length: 9 cm Assessment Sensory level: T6 Additional Notes Expiration date of kit checked and confirmed. Patient tolerated procedure well, without complications.

## 2016-03-03 NOTE — Brief Op Note (Signed)
03/03/2016  6:33 PM  PATIENT:  Kelly Hahn  65 y.o. female  PRE-OPERATIVE DIAGNOSIS:  LEFT KNEE PATELLA FRACTURE  POST-OPERATIVE DIAGNOSIS:  LEFT KNEE closed comminuted PATELLA FRACTURE  PROCEDURE:  Procedure(s): OPEN REDUCTION INTERNAL (ORIF) FIXATION PATELLA (Left)  SURGEON:  Surgeon(s) and Role:    * Paralee Cancel, MD - Primary  PHYSICIAN ASSISTANT: Molli Barrows, PA-C   ANESTHESIA:   spinal  EBL:  Total I/O In: 1000 [I.V.:1000] Out: 350 [Urine:300; Blood:50]  BLOOD ADMINISTERED:none  DRAINS: none   LOCAL MEDICATIONS USED:  NONE  SPECIMEN:  No Specimen  DISPOSITION OF SPECIMEN:  N/A  COUNTS:  YES  TOURNIQUET:   Total Tourniquet Time Documented: Thigh (Left) - 65 minutes Total: Thigh (Left) - 65 minutes   DICTATION: .Other Dictation: Dictation Number [id number]  PLAN OF CARE: Admit for overnight observation  PATIENT DISPOSITION:  PACU - hemodynamically stable.   Delay start of Pharmacological VTE agent (>24hrs) due to surgical blood loss or risk of bleeding: no

## 2016-03-03 NOTE — Anesthesia Postprocedure Evaluation (Signed)
Anesthesia Post Note  Patient: SARAIH GRIESEMER  Procedure(s) Performed: Procedure(s) (LRB): OPEN REDUCTION INTERNAL (ORIF) FIXATION PATELLA (Left)  Patient location during evaluation: PACU Anesthesia Type: Spinal Level of consciousness: oriented and awake and alert Pain management: pain level controlled Vital Signs Assessment: post-procedure vital signs reviewed and stable Respiratory status: spontaneous breathing, respiratory function stable and patient connected to nasal cannula oxygen Cardiovascular status: blood pressure returned to baseline and stable Postop Assessment: no headache, no backache, spinal receding and patient able to bend at knees Anesthetic complications: no    Last Vitals:  Vitals:   03/03/16 2000 03/03/16 2015  BP: 129/85 120/79  Pulse: 78 76  Resp: 16 13  Temp: 36.6 C     Last Pain:  Vitals:   03/03/16 2015  PainSc: 4                  Imraan Wendell J

## 2016-03-04 ENCOUNTER — Encounter (HOSPITAL_COMMUNITY): Payer: Self-pay | Admitting: Orthopedic Surgery

## 2016-03-04 LAB — BASIC METABOLIC PANEL
Anion gap: 5 (ref 5–15)
BUN: 13 mg/dL (ref 6–20)
CO2: 28 mmol/L (ref 22–32)
Calcium: 8.7 mg/dL — ABNORMAL LOW (ref 8.9–10.3)
Chloride: 106 mmol/L (ref 101–111)
Creatinine, Ser: 0.53 mg/dL (ref 0.44–1.00)
GFR calc Af Amer: >60 mL/min (ref >60)
GFR calc non Af Amer: >60 mL/min (ref >60)
Glucose, Bld: 149 mg/dL — ABNORMAL HIGH (ref 65–99)
Potassium: 4 mmol/L (ref 3.5–5.1)
Sodium: 139 mmol/L (ref 135–145)

## 2016-03-04 LAB — CBC
HCT: 33.1 % — ABNORMAL LOW (ref 36.0–46.0)
Hemoglobin: 10.7 g/dL — ABNORMAL LOW (ref 12.0–15.0)
MCH: 26.5 pg (ref 26.0–34.0)
MCHC: 32.3 g/dL (ref 30.0–36.0)
MCV: 81.9 fL (ref 78.0–100.0)
Platelets: 282 10*3/uL (ref 150–400)
RBC: 4.04 MIL/uL (ref 3.87–5.11)
RDW: 13.5 % (ref 11.5–15.5)
WBC: 7 10*3/uL (ref 4.0–10.5)

## 2016-03-04 MED ORDER — SODIUM CHLORIDE 0.9 % IV BOLUS (SEPSIS)
500.0000 mL | Freq: Once | INTRAVENOUS | Status: AC
  Administered 2016-03-04: 500 mL via INTRAVENOUS

## 2016-03-04 MED ORDER — HYDROMORPHONE HCL 2 MG PO TABS
2.0000 mg | ORAL_TABLET | ORAL | Status: DC | PRN
  Administered 2016-03-04 – 2016-03-05 (×3): 2 mg via ORAL
  Filled 2016-03-04 (×4): qty 1

## 2016-03-04 NOTE — Progress Notes (Signed)
Physical Therapy Treatment Patient Details Name: Kelly Hahn MRN: [id number] DOB: 12-23-1950 Today's Date: 03/04/2016    History of Present Illness Pt is a 65 y/o female s/p L ORIF for patella fx.     PT Comments    Pt ambulated to bathroom and in hallway and was assisted back to chair. Pt reported feeling more confident mobilizing this afternoon, however she is still fearful to put too much weight through surgical LE. Planning to practice safe stair technique tomorrow.  Follow Up Recommendations  Home health PT (PT may be more beneficial after restrictions are lifted)     Equipment Recommendations  Rolling walker with 5" wheels    Recommendations for Other Services       Precautions / Restrictions Precautions Precautions: Fall Precaution Comments: bledsoe brace at all times Restrictions Weight Bearing Restrictions: No Other Position/Activity Restrictions: WBAT with KI/bledsloe brace    Mobility  Bed Mobility Overal bed mobility: Needs Assistance Bed Mobility: Supine to Sit     Supine to sit: Min assist     General bed mobility comments: sitting in chair upon arrival  Transfers Overall transfer level: Needs assistance Equipment used: Rolling walker (2 wheeled) Transfers: Sit to/from Stand Sit to Stand: Min guard         General transfer comment: verbal cues for controlled rise and descent and LE positioning  Ambulation/Gait Ambulation/Gait assistance: Min guard Ambulation Distance (Feet): 125 Feet Assistive device: Rolling walker (2 wheeled) Gait Pattern/deviations: Step-to pattern;Decreased stance time - left;Antalgic     General Gait Details: verbal cues for WBAT and RW positioning; guarding for safety   Stairs            Wheelchair Mobility    Modified Rankin (Stroke Patients Only)       Balance Overall balance assessment: Needs assistance         Standing balance support: Bilateral upper extremity supported Standing  balance-Leahy Scale: Poor Standing balance comment: required RW for UE support during mobility                    Cognition Arousal/Alertness: Awake/alert Behavior During Therapy: WFL for tasks assessed/performed Overall Cognitive Status: Within Functional Limits for tasks assessed                      Exercises      General Comments        Pertinent Vitals/Pain Pain Assessment: 0-10 Pain Score: 2  Faces Pain Scale: Hurts little more Pain Location: L knee Pain Descriptors / Indicators: Aching;Sore Pain Intervention(s): Limited activity within patient's tolerance;Monitored during session;Repositioned;Ice applied    Home Living Family/patient expects to be discharged to:: Private residence Living Arrangements: Spouse/significant other Available Help at Discharge: Available PRN/intermittently;Family Type of Home: House Home Access: Stairs to enter Entrance Stairs-Rails: Right;Left Home Layout: Two level Home Equipment: Kasandra Knudsen - single point Additional Comments: Arranging main level for home care. Pt states husband can do limited lifting.  He can lift her leg to help with pants or OOB.      Prior Function Level of Independence: Independent          PT Goals (current goals can now be found in the care plan section) Acute Rehab PT Goals Patient Stated Goal: return to being independent; walk and avoid blood clot PT Goal Formulation: With patient Time For Goal Achievement: 03/11/16 Potential to Achieve Goals: Good Progress towards PT goals: Progressing toward goals    Frequency  7X/week      PT Plan Current plan remains appropriate    Co-evaluation             End of Session Equipment Utilized During Treatment: Gait belt (L bledsloe brace) Activity Tolerance: Patient tolerated treatment well Patient left: in chair;with call bell/phone within reach;with family/visitor present     Time: 1453-1511 PT Time Calculation (min) (ACUTE ONLY): 18  min  Charges:  $Gait Training: 8-22 mins                    G Codes:      Dewitt Hoes 2016-04-01, 3:19 PM Dewitt Hoes, SPT

## 2016-03-04 NOTE — Care Management Note (Signed)
Case Management Note  Patient Details  Name: JOELLE ROSWELL MRN: [id number] Date of Birth: 1950/09/27  Subjective/Objective:                  Left patella fracture. Action/Plan: Discharge planning Expected Discharge Date:  03/05/16               Expected Discharge Plan:  [place]  In-House Referral:     Discharge planning Services  CM Consult  Post Acute Care Choice:  Home Health Choice offered to:  Patient  DME Arranged:  Walker rolling DME Agency:  [place]:  PT [place] Agency:  [facility] ([facility])  Status of Service:  Completed, signed off  If discussed at [employer] of Stay Meetings, dates discussed:    Additional Comments: CM met with pt and pt's spouse in room to offer choice of home health agency.  Pt chooses [facility] to render HHPT.  Referral given to [facility] rep, Tim.  CM notified [facility] DME rep, Reggie to please deliver the rolling walker to room prior to discharge.  No other CM needs were communicated. Dellie Catholic, RN 03/04/2016, 2:08 PM

## 2016-03-04 NOTE — Evaluation (Signed)
Occupational Therapy Evaluation Patient Details Name: Kelly Hahn MRN: [id number] DOB: 10-21-1950 Today's Date: 03/04/2016    History of Present Illness s/p L ORIF for patella fx   Clinical Impression   This 65 year old female was admitted for the above sx secondary to fall.  She will benefit from continued OT in acute to increase safety and independence with adls. Goals are for supervision level    Follow Up Recommendations  Supervision/Assistance - 24 hour (initially)    Equipment Recommendations  3 in 1 bedside comode    Recommendations for Other Services       Precautions / Restrictions Precautions Precautions: Fall Precaution Comments: bledsloe brace at all times Restrictions Weight Bearing Restrictions: No Other Position/Activity Restrictions: WBAT with KI/bledsloe brace      Mobility Bed Mobility Overal bed mobility: Needs Assistance Bed Mobility: Supine to Sit     Supine to sit: Min assist     General bed mobility comments: assist for LLE; HOB raised  Transfers Overall transfer level: Needs assistance Equipment used: Rolling walker (2 wheeled) Transfers: Sit to/from Stand Sit to Stand: Min guard         General transfer comment: for safety.  Cues for UE/LE placement    Balance Overall balance assessment: Needs assistance                                       ADL Overall ADL's : Needs assistance/impaired             Lower Body Bathing: Minimal assistance;Sit to/from stand       Lower Body Dressing: Moderate assistance;Sit to/from stand   Toilet Transfer: Min guard;Stand-pivot;RW (to recliner)   Toileting- Water quality scientist and Hygiene: Min guard         General ADL Comments: pt is able to reach to ankle but she cannot lift LLE comfortably for adls.  Pt will sponge bathe as she needs brace 24/7. She will need 3:1 for over toilet.  Pt was asking how she could use 3:1 next to chair and dump bucket  herself; she cannot safely do this. Recommended using over commode. She can sidestep through the door.  Pt is not interested in AE; husband can assist with ADLs     Vision     Perception     Praxis      Pertinent Vitals/Pain Pain Assessment: 0-10 Pain Score: 5  Faces Pain Scale: Hurts little more Pain Location: L knee Pain Descriptors / Indicators: Aching;Sore Pain Intervention(s): Limited activity within patient's tolerance;Monitored during session;Premedicated before session;Repositioned     Hand Dominance     Extremity/Trunk Assessment Upper Extremity Assessment Upper Extremity Assessment: Overall WFL for tasks assessed          Communication Communication Communication: No difficulties   Cognition Arousal/Alertness: Awake/alert Behavior During Therapy: WFL for tasks assessed/performed Overall Cognitive Status: Within Functional Limits for tasks assessed                     General Comments       Exercises       Shoulder Instructions      Home Living Family/patient expects to be discharged to:: Private residence Living Arrangements: Spouse/significant other Available Help at Discharge: Available PRN/intermittently;Family Type of Home: House Home Access: Stairs to enter     Home Layout: Two level  Home Equipment: Kasandra Knudsen - single point   Additional Comments: pt states husband can do limited lifting.  He can lift her leg to help with pants or OOB.        Prior Functioning/Environment Level of Independence: Independent                 OT Problem List: Decreased strength;Decreased activity tolerance;Pain;Decreased knowledge of use of DME or AE   OT Treatment/Interventions: Self-care/ADL training;DME and/or AE instruction;Patient/family education;Therapeutic activities    OT Goals(Current goals can be found in the care plan section) Acute Rehab OT Goals Patient Stated Goal: return to being independent; walk and avoid  blood clot OT Goal Formulation: With patient Time For Goal Achievement: 03/11/16 Potential to Achieve Goals: Good ADL Goals Pt Will Transfer to Toilet: with supervision;ambulating;bedside commode Pt Will Perform Toileting - Clothing Manipulation and hygiene: with supervision;sitting/lateral leans;sit to/from stand Additional ADL Goal #1: pt will perform grooming at supervision level, standing at sink  OT Frequency: Min 2X/week   Barriers to D/C:            Co-evaluation              End of Session    Activity Tolerance: Patient tolerated treatment well Patient left: in chair;with call bell/phone within reach   Time: [id number] and [id number] OT Time Calculation (min): 25 min Charges:  OT General Charges $OT Visit: 1 Procedure OT Evaluation $OT Eval Low Complexity: 1 Procedure OT Treatments $Therapeutic Activity: 8-22 mins G-Codes:    Yuriy Cui 21-Mar-2016, 12:45 PM Lesle Chris, OTR/L [phone] 03/21/2016

## 2016-03-04 NOTE — Progress Notes (Signed)
     Subjective: 1 Day Post-Op Procedure(s) (LRB): OPEN REDUCTION INTERNAL (ORIF) FIXATION PATELLA (Left)   Patient reports pain as mild, pain controlled.  Dr. Alvan Dame reviewed the procedure and limitation in the post-op period.    Objective:   VITALS:   Vitals:   03/04/16 0614 03/04/16 0856  BP: (!) 88/51 (!) 92/50  Pulse: 74   Resp: 15   Temp: 97.9 F (36.6 C)     Dorsiflexion/Plantar flexion intact Incision: dressing C/D/I No cellulitis present Compartment soft  LABS  Recent Labs  03/03/16 1430 03/04/16 0439  HGB 12.3 10.7*  HCT 38.3 33.1*  WBC 5.7 7.0  PLT 294 282     Recent Labs  03/03/16 1430 03/04/16 0439  NA 142 139  K 3.6 4.0  BUN 13 13  CREATININE 0.56 0.53  GLUCOSE 96 149*     Assessment/Plan: 1 Day Post-Op Procedure(s) (LRB): OPEN REDUCTION INTERNAL (ORIF) FIXATION PATELLA (Left) Foley cath d/c'ed Advance diet Up with therapy D/C IV fluids Discharge home with home health, eventually when ready       West Pugh. Zaccary Creech   PAC  03/04/2016, 9:02 AM

## 2016-03-04 NOTE — Op Note (Signed)
NAMEGOWRI, Kelly Hahn           ACCOUNT NO.:  [id number]  MEDICAL RECORD NO.:  [id number]  LOCATION:  [id number]                         FACILITY:  [facility]  PHYSICIAN:  Pietro Cassis. Alvan Dame, M.D.  DATE OF BIRTH:  1951/03/18  DATE OF PROCEDURE:  03/03/2016 DATE OF DISCHARGE:                              OPERATIVE REPORT   PREOPERATIVE DIAGNOSIS:  Left patella fracture.  POSTOPERATIVE DIAGNOSIS/FINDINGS:  The patient was noted have a closed left comminuted patella fracture.  PROCEDURE:  Open reduction and internal fixation of left patella fracture utilizing 4.0 cannulated screws and a #5 FiberWire suture as well as augmentation with #1 Vicryl sutures.  SURGEON:  Pietro Cassis. Alvan Dame, M.D.  ASSISTANT:  Kelly Part. Chabon, PA-C.  Note that, Mr. Kelly Hahn was present for the entirety of the case from preoperative positioning, perioperative management of the operative extremity, general facilitation of the case, and primary wound closure.  ANESTHESIA:  Spinal.  SPECIMENS:  None.  COMPLICATIONS:  None.  TOURNIQUET TIME:  Tourniquet was utilized and up for 60 minutes at 250 mmHg.  INDICATIONS FOR PROCEDURE:  Kelly Hahn is a pleasant 65 year old female who unfortunately slipped on a wet floor while trying to help her mom at her nursing facility residence.  She landed directly on her left knee.  She was seen and evaluated in the office yesterday and scheduled for surgery today.  She was noted to have a displaced patella fracture without evidence of any open lacerations.  Risks, benefits, and necessity of the procedure were discussed and reviewed.  Postoperative course was reviewed.  PROCEDURE IN DETAIL:  The patient was brought to the operative theater. Once adequate anesthesia was established, preoperative antibiotics, Ancef was administered.  She was positioned supine.  The left thigh tourniquet was placed.  The left lower extremity was then prepped and draped in sterile fashion.  A time-out  was performed identifying the patient, planned procedure, and extremity.  Leg was exsanguinated. Tourniquet elevated to 250 mmHg.  Midline incision was made.  Once the soft tissue plane was created and evacuation of initial subcutaneous hematoma, we identified the fracture site.  When we opened up the fracture, identified a large hemarthrosis, which was evacuated.  The knee was then irrigated.  At this point, upon inspection of the fracture, we identified more significant comminution that was evident on her plain radiographs.  The proximal pole of patella proximal half of the patella was intact.  The distal segment had 2 large comminuted sections as well as dorsal-based comminution.  Once we were able to identify all these landmarks, we used a combination of bone, tenaculum, and towel hooks to best orient the patella into its anatomic position.  Once this was done and confirmed radiographically in AP and lateral planes, I passed 2 of the K-wires for the cannulated screws.  We again confirmed the orientation of these in AP and lateral planes radiographically.  At this point, it appeared that on the AP view, the fracture was anatomically positioned.  On the lateral view, though there may have been a minor step-off, this seemed to be very acceptable given the level of comminution present.  At this point, I placed two 4.0 cancellous screws over top of  the guidewires.  With these in place, we then passed the #5 FiberWire suture and then passed this over the top of the knee in a tension band type pattern.  I was able to place the comminuted dorsal segment underneath this suture pattern as well in order to maintain its orientation.  Once this was done, final radiographs were obtained in the AP and lateral planes.  We used a #1 Vicryl on the medial and lateral retinacular sides to reapproximate this tissue and then reapproximated the areas where the screws have been placed as  longitudinal slits in the tendon with #1 Vicryl.  The remainder of the wound was closed with 2-0 Vicryl and running 4-0 Monocryl.  The knee was then cleaned, dried, and dressed sterilely using surgical glue and Aquacel dressing.  She was placed in an Ace wrap and a knee immobilizer.  Postoperatively, she will be allowed to be weightbearing as tolerated. We will order her a Bledsoe type TROM brace locked in extension, which she will wear for 4 to 6 weeks.  It will be important to maintain as much extension and prevent excessive flexion through this knee to allow for bony healing given the level combination or else we could stand to have complications in terms of malunion and nonunion.  Findings were reviewed with her husband.     Pietro Cassis Alvan Dame, M.D.     MDO/MEDQ  D:  03/03/2016  T:  03/04/2016  Job:  [id number]

## 2016-03-04 NOTE — Evaluation (Signed)
Physical Therapy Evaluation Patient Details Name: Kelly Hahn MRN: [id number] DOB: [birth year] Today's Date: 03/04/2016   History of Present Illness  Pt is a 65 y/o female s/p L ORIF for patella fx.   Clinical Impression  Pt is s/p L ORIF for patella fx resulting in functional limitations due to the deficits listed below (PT Problems List). Pt will benefit from skilled PT to increase her independence and safety with mobility to allow discharge. Pt is able to perform all mobility with minimal assist or min guard at this time, however pt reports feeling anxious about WBAT precautions as well as mobility with the surgical lower extremity.     Follow Up Recommendations No PT follow up (f/u PT needs pending MD once restrictions lifted)    Equipment Recommendations  Rolling walker with 5" wheels    Recommendations for Other Services       Precautions / Restrictions Precautions Precautions: Fall Precaution Comments: bledsoe brace at all times Restrictions Weight Bearing Restrictions: No Other Position/Activity Restrictions: WBAT with KI/bledsloe brace      Mobility  Bed Mobility Overal bed mobility: Needs Assistance Bed Mobility: Supine to Sit     Supine to sit: Min assist     General bed mobility comments: sitting in chair upon arrival  Transfers Overall transfer level: Needs assistance Equipment used: Rolling walker (2 wheeled) Transfers: Sit to/from Stand Sit to Stand: Min assist         General transfer comment: assistance for rise and controlled descent; verbal cues for UE and L LE positioning  Ambulation/Gait Ambulation/Gait assistance: Min guard Ambulation Distance (Feet): 80 Feet Assistive device: Rolling walker (2 wheeled) Gait Pattern/deviations: Step-to pattern;Decreased stance time - left;Antalgic     General Gait Details: verbal cues for WBAT and RW positioning; guarding for safety; recliner following  Stairs            Wheelchair  Mobility    Modified Rankin (Stroke Patients Only)       Balance Overall balance assessment: Needs assistance         Standing balance support: Bilateral upper extremity supported Standing balance-Leahy Scale: Poor Standing balance comment: required RW for UE support during mobility                             Pertinent Vitals/Pain Pain Assessment: 0-10 Pain Score: 5  Faces Pain Scale: Hurts little more Pain Location: L knee Pain Descriptors / Indicators: Aching;Sore Pain Intervention(s): Limited activity within patient's tolerance;Monitored during session;Premedicated before session;Repositioned    Home Living Family/patient expects to be discharged to:: Private residence Living Arrangements: Spouse/significant other Available Help at Discharge: Available PRN/intermittently;Family Type of Home: House Home Access: Stairs to enter     Home Layout: Two level Home Equipment: Kasandra Knudsen - single point Additional Comments: pt states husband can do limited lifting.  He can lift her leg to help with pants or OOB.      Prior Function Level of Independence: Independent               Hand Dominance        Extremity/Trunk Assessment   Upper Extremity Assessment: Overall WFL for tasks assessed           Lower Extremity Assessment: LLE deficits/detail   LLE Deficits / Details: anticipated post-surgery weakness; pt able to bear weight through L LE; no ROM assessed per surgical precautions; unable to perform SLR     Communication  Communication: No difficulties  Cognition Arousal/Alertness: Awake/alert Behavior During Therapy: WFL for tasks assessed/performed Overall Cognitive Status: Within Functional Limits for tasks assessed                      General Comments      Exercises     Assessment/Plan    PT Assessment Patient needs continued PT services  PT Problem List Decreased strength;Decreased balance;Decreased mobility;Decreased  knowledge of use of DME;Decreased safety awareness;Decreased knowledge of precautions          PT Treatment Interventions DME instruction;Gait training;Stair training;Functional mobility training;Therapeutic activities;Therapeutic exercise;Balance training;Patient/family education    PT Goals (Current goals can be found in the Care Plan section)  Acute Rehab PT Goals Patient Stated Goal: return to being independent; walk and avoid blood clot PT Goal Formulation: With patient Time For Goal Achievement: 03/11/16 Potential to Achieve Goals: Good    Frequency 7X/week   Barriers to discharge        Co-evaluation               End of Session Equipment Utilized During Treatment: Gait belt (L bledsloe brace) Activity Tolerance: Patient tolerated treatment well Patient left: in chair;with call bell/phone within reach;with chair alarm set Nurse Communication: Mobility status         Time: [id number] PT Time Calculation (min) (ACUTE ONLY): 19 min   Charges:   PT Evaluation $PT Eval Low Complexity: 1 Procedure     PT G CodesDewitt Hoes March 07, 2016, 2:32 PM Dewitt Hoes, SPT

## 2016-03-05 LAB — BASIC METABOLIC PANEL
Anion gap: 4 — ABNORMAL LOW (ref 5–15)
BUN: 18 mg/dL (ref 6–20)
CO2: 26 mmol/L (ref 22–32)
Calcium: 8.5 mg/dL — ABNORMAL LOW (ref 8.9–10.3)
Chloride: 110 mmol/L (ref 101–111)
Creatinine, Ser: 0.57 mg/dL (ref 0.44–1.00)
GFR calc Af Amer: >60 mL/min (ref >60)
GFR calc non Af Amer: >60 mL/min (ref >60)
Glucose, Bld: 158 mg/dL — ABNORMAL HIGH (ref 65–99)
Potassium: 3.8 mmol/L (ref 3.5–5.1)
Sodium: 140 mmol/L (ref 135–145)

## 2016-03-05 LAB — CBC
HCT: 30.4 % — ABNORMAL LOW (ref 36.0–46.0)
Hemoglobin: 9.8 g/dL — ABNORMAL LOW (ref 12.0–15.0)
MCH: 27.2 pg (ref 26.0–34.0)
MCHC: 32.2 g/dL (ref 30.0–36.0)
MCV: 84.4 fL (ref 78.0–100.0)
Platelets: 248 10*3/uL (ref 150–400)
RBC: 3.6 MIL/uL — ABNORMAL LOW (ref 3.87–5.11)
RDW: 13.8 % (ref 11.5–15.5)
WBC: 9.5 10*3/uL (ref 4.0–10.5)

## 2016-03-05 MED ORDER — POLYETHYLENE GLYCOL 3350 17 G PO PACK: 17.0000 g | PACK | Freq: Two times a day (BID) | ORAL | 0 refills | Status: DC

## 2016-03-05 MED ORDER — METHOCARBAMOL 500 MG PO TABS: 500.0000 mg | ORAL_TABLET | Freq: Four times a day (QID) | ORAL | 0 refills | Status: DC | PRN

## 2016-03-05 MED ORDER — ASPIRIN 81 MG PO CHEW: 81.0000 mg | CHEWABLE_TABLET | Freq: Two times a day (BID) | ORAL | 0 refills | Status: AC

## 2016-03-05 MED ORDER — HYDROMORPHONE HCL 2 MG PO TABS: 2.0000 mg | ORAL_TABLET | ORAL | 0 refills | Status: DC | PRN

## 2016-03-05 MED ORDER — DOCUSATE SODIUM 100 MG PO CAPS: 100.0000 mg | ORAL_CAPSULE | Freq: Two times a day (BID) | ORAL | 0 refills | Status: DC

## 2016-03-05 MED ORDER — ACETAMINOPHEN 500 MG PO TABS: 1000.0000 mg | ORAL_TABLET | Freq: Three times a day (TID) | ORAL | 0 refills | Status: DC

## 2016-03-05 NOTE — Progress Notes (Signed)
Physical Therapy Treatment Note    03/05/16 1300  PT Visit Information  Last PT Received On 03/05/16  Assistance Needed +1  History of Present Illness Pt is a 65 y/o female s/p L ORIF for patella fx.   Subjective Data  Subjective Pt ambulated in hallway and performed safe stair technique. Pt reports decreased pain with all mobility and feeling less fearful today. Pt is eager to return home to continue healing with assistance from home health PT.  Precautions  Precautions Fall  Precaution Comments bledsoe brace at all times  Restrictions  Weight Bearing Restrictions No  Other Position/Activity Restrictions WBAT with KI/bledsloe brace  Pain Assessment  Pain Assessment 0-10  Pain Score 3 (only with mobility)  Pain Location L knee  Pain Descriptors / Indicators Sore  Pain Intervention(s) Limited activity within patient's tolerance;Monitored during session;Repositioned;Ice applied  Cognition  Arousal/Alertness Awake/alert  Behavior During Therapy WFL for tasks assessed/performed  Overall Cognitive Status Within Functional Limits for tasks assessed  Bed Mobility  General bed mobility comments sitting in chair upon arrival  Transfers  Overall transfer level Needs assistance  Equipment used Rolling walker (2 wheeled)  Transfers Sit to/from Stand  Sit to Stand Supervision  General transfer comment verbal cues for controlled rise and descent and LE positioning  Ambulation/Gait  Ambulation/Gait assistance Min guard  Ambulation Distance (Feet) 240 Feet  Assistive device Rolling walker (2 wheeled)  Gait Pattern/deviations Step-to pattern;Decreased stance time - left;Step-through pattern  General Gait Details initially step-to pattern, step-through pattern as pt ambulated further; verbal cues for WBAT and RW positioning; guarding for safety  Stairs Yes  Stairs assistance Min guard  Stair Management One rail Left;Sideways;Step to pattern  Number of Stairs 4  General stair comments  performed 2 larger steps x 2 reps; verbal cues for UE and LE positioning, sequencing, and WBAT maintenance; guarding for safety   PT - End of Session  Equipment Utilized During Treatment Gait belt (L bledsloe brace)  Activity Tolerance Patient tolerated treatment well  Patient left in chair;with call bell/phone within reach  PT - Assessment/Plan  PT Plan Current plan remains appropriate  PT Frequency (ACUTE ONLY) 7X/week  Follow Up Recommendations Home health PT  PT equipment Rolling walker with 5" wheels  PT Goal Progression  Progress towards PT goals Progressing toward goals  PT Time Calculation  PT Start Time (ACUTE ONLY) 1122  PT Stop Time (ACUTE ONLY) 1157  PT Time Calculation (min) (ACUTE ONLY) 35 min  PT General Charges  $$ ACUTE PT VISIT 1 Procedure  PT Treatments  $Gait Training 23-37 mins   Treatment and note performed by Dewitt Hoes, SPT with guidance and supervision from Carmelia Bake, [place], PT, DPT 03/05/2016 Pager: [phone]

## 2016-03-05 NOTE — Progress Notes (Signed)
Occupational Therapy Treatment Patient Details Name: Kelly Hahn MRN: [id number] DOB: 09-Feb-1951 Today's Date: 03/05/2016    History of present illness Pt is a 65 y/o female s/p L ORIF for patella fx.    OT comments  Pt ready to DC this day with husband to A  Follow Up Recommendations  No OT follow up    Equipment Recommendations  3 in 1 bedside comode    Recommendations for Other Services      Precautions / Restrictions Precautions Precautions: Fall Precaution Comments: bledsoe brace at all times Restrictions Weight Bearing Restrictions: No Other Position/Activity Restrictions: WBAT with KI/bledsloe brace       Mobility Bed Mobility               General bed mobility comments: sitting in chair upon arrival  Transfers Overall transfer level: Needs assistance Equipment used: Rolling walker (2 wheeled) Transfers: Sit to/from [facility] Sit to Stand: Supervision              Balance                                   ADL Overall ADL's : Needs assistance/impaired     Grooming: Standing;Supervision/safety       Lower Body Bathing: Min guard;Sit to/from stand   Upper Body Dressing : Set up;Sitting   Lower Body Dressing: Minimal assistance;Sit to/from stand   Toilet Transfer: Supervision/safety;RW;Ambulation;Comfort height toilet   Toileting- Clothing Manipulation and Hygiene: Sit to/from stand;Cueing for safety;Supervision/safety         General ADL Comments: husband will A as needed       Vision                            Cognition   Behavior During Therapy: WFL for tasks assessed/performed Overall Cognitive Status: Within Functional Limits for tasks assessed                                    Pertinent Vitals/ Pain       Pain Score: 3  Pain Location: L knee Pain Descriptors / Indicators: Sore Pain Intervention(s): Monitored during session;Ice applied  Home Living                                               Frequency  Min 2X/week        Progress Toward Goals  OT Goals(current goals can now be found in the care plan section)  Progress towards OT goals: Progressing toward goals  Acute Rehab OT Goals Patient Stated Goal: return to being independent; walk and avoid blood clot  Plan Discharge plan remains appropriate       End of Session     Activity Tolerance Patient tolerated treatment well   Patient Left in chair   Nurse Communication Mobility status        Time: 1010-1035 OT Time Calculation (min): 25 min  Charges: OT General Charges $OT Visit: 1 Procedure OT Treatments $Self Care/Home Management : 23-37 mins  Kelly Hahn, Thereasa Parkin 03/05/2016, 10:48 AM

## 2016-03-05 NOTE — Progress Notes (Signed)
Discharged from floor via w/c for transport home by car. Spouse and belongings with pt. No changes in assessment. Marge Vandermeulen, [employer]

## 2016-03-05 NOTE — Progress Notes (Signed)
     Subjective: 2 Days Post-Op Procedure(s) (LRB): OPEN REDUCTION INTERNAL (ORIF) FIXATION PATELLA (Left)   Patient reports pain as mild, pain controlled.  No events throughout the night.  Questions were encouraged and answered.  Ready to be discharged home.   Objective:   VITALS:   Vitals:   03/04/16 2200 03/05/16 0539  BP: (!) 105/58 100/62  Pulse: 62 72  Resp: 16 16  Temp: 98.9 F (37.2 C) 98.4 F (36.9 C)    Dorsiflexion/Plantar flexion intact Incision: dressing C/D/I and no drainage No cellulitis present Compartment soft  LABS  Recent Labs  03/03/16 1430 03/04/16 0439 03/05/16 0439  HGB 12.3 10.7* 9.8*  HCT 38.3 33.1* 30.4*  WBC 5.7 7.0 9.5  PLT 294 282 248     Recent Labs  03/03/16 1430 03/04/16 0439 03/05/16 0439  NA 142 139 140  K 3.6 4.0 3.8  BUN 13 13 18   CREATININE 0.56 0.53 0.57  GLUCOSE 96 149* 158*     Assessment/Plan: 2 Days Post-Op Procedure(s) (LRB): OPEN REDUCTION INTERNAL (ORIF) FIXATION PATELLA (Left) Up with therapy Discharge home with home health  Follow up in 2 weeks at [facility]. Follow up with OLIN,Advaith Lamarque D in 2 weeks.  Contact information:  [facility] [address] [place] Malan Werk   PAC  03/05/2016, 8:04 AM

## 2016-03-06 DIAGNOSIS — M810 Age-related osteoporosis without current pathological fracture: Secondary | ICD-10-CM | POA: Diagnosis not present

## 2016-03-06 DIAGNOSIS — W19XXXD Unspecified fall, subsequent encounter: Secondary | ICD-10-CM | POA: Diagnosis not present

## 2016-03-06 DIAGNOSIS — Z9181 History of falling: Secondary | ICD-10-CM | POA: Diagnosis not present

## 2016-03-06 DIAGNOSIS — S82042D Displaced comminuted fracture of left patella, subsequent encounter for closed fracture with routine healing: Secondary | ICD-10-CM | POA: Diagnosis not present

## 2016-03-06 NOTE — Discharge Summary (Signed)
Physician Discharge Summary  Patient ID: Kelly Hahn MRN: [id number] DOB/AGE: 1951-04-02 65 y.o.  Admit date: 03/03/2016 Discharge date: 03/05/2016   Procedures:  Procedure(s) (LRB): OPEN REDUCTION INTERNAL (ORIF) FIXATION PATELLA (Left)  Attending Physician:  Dr. Paralee Cancel   Admission Diagnoses:   Left patella fracture  Discharge Diagnoses:  Principal Problem:   Left patella fracture  Past Medical History:  Diagnosis Date  . Anemia   . Endometriosis   . Fall   . Fracture of arm   . Heart murmur   . High cholesterol   . Osteoporosis   . Palpitations   . Thyroid disease    Nodules    HPI:     65 year old healthy female who presents with left knee pain after a fall. She was seen previously in her PCP's office after sustaining a fall. On February 28, 2016, the patient was attempting to assist her mother in the bathroom when she slipped on the wet floor, falling directly onto her left knee. She had a sudden onset of severe, constant knee pain and noticed a "divet" in the middle of her patella. She has had swelling and bruising since then and has been unable to bear weight on her L leg.  She was referred to our clinic where x-rays revealed a horizontal fracture through the middle of the patella.  We have discussed various options and understands that surgery is needed to repair the fractured patella.  Risks, benefits and expectations were discussed with the patient.  Risks including but not limited to the risk of anesthesia, blood clots, nerve damage, blood vessel damage, failure of the prosthesis, infection and up to and including death.  Patient understand the risks, benefits and expectations and wishes to proceed with surgery. The patient and situation was discussed with Dr. Alvan Dame who will meet the patient tomorrow at the hospital prior to surgery to repair the patella.  PCP: Donnajean Lopes, MD   Discharged Condition: good  Hospital Course:  Patient underwent the  above stated procedure on 03/03/2016. Patient tolerated the procedure well and brought to the recovery room in good condition and subsequently to the floor.  POD #1 BP: 92/50 ; Pulse: 74 ; Temp: 97.9 F (36.6 C) ; Resp: 15 Patient reports pain as mild, pain controlled.  Dr. Alvan Dame reviewed the procedure and limitation in the post-op period.  Dorsiflexion/plantar flexion intact, incision: dressing C/D/I, no cellulitis present and compartment soft.   LABS  Basename    HGB     10.7  HCT     33.1   POD #2  BP: 100/62 ; Pulse: 72 ; Temp: 98.4 F (36.9 C) ; Resp: 16 Patient reports pain as mild, pain controlled.  No events throughout the night.  Questions were encouraged and answered.  Ready to be discharged home. Dorsiflexion/plantar flexion intact, incision: dressing C/D/I, no cellulitis present and compartment soft.   LABS  Basename    HGB     9.8  HCT     30.4    Discharge Exam: General appearance: alert, cooperative and no distress Extremities: Homans sign is negative, no sign of DVT, no edema, redness or tenderness in the calves or thighs and no ulcers, gangrene or trophic changes  Disposition: Home with follow up in 2 weeks   [place] .   Why:  rolling walker Contact information: [address] [place] [address] [phone]  [facility] .   Why:  now known as [facility].  This agency will call you to schedule your at home physical therapy. Contact information: [address] [place] [address] [phone]        [name], MD. Schedule an appointment as soon as possible for a visit in 2 week(s).   Specialty:  Orthopedic Surgery Contact information: [address] [place] [address] [phone]           Discharge Instructions    Call MD / Call 911    Complete by:  As directed    If you experience chest pain or shortness of breath, CALL 911  and be transported to the hospital emergency room.  If you develope a fever above 101 F, pus (white drainage) or increased drainage or redness at the wound, or calf pain, call your surgeon's office.   Change dressing    Complete by:  As directed    Maintain surgical dressing until follow up in the clinic. If the edges start to pull up, may reinforce with tape. If the dressing is no longer working, may remove and cover with gauze and tape, but must keep the area dry and clean.  Call with any questions or concerns.   Constipation Prevention    Complete by:  As directed    Drink plenty of fluids.  Prune juice may be helpful.  You may use a stool softener, such as Colace (over the counter) 100 mg twice a day.  Use MiraLax (over the counter) for constipation as needed.   Diet - low sodium heart healthy    Complete by:  As directed    Discharge instructions    Complete by:  As directed    Maintain surgical dressing until follow up in the clinic. If the edges start to pull up, may reinforce with tape. If the dressing is no longer working, may remove and cover with gauze and tape, but must keep the area dry and clean.  Follow up in 2 weeks at [facility]. Call with any questions or concerns.   TED hose    Complete by:  As directed    Use stockings (TED hose) for 2 weeks on both leg(s).  You may remove them at night for sleeping.   Weight bearing as tolerated    Complete by:  As directed    Knee immobilizer on with ambulation.   Extremity:  Lower        Medication List    STOP taking these medications   acetaminophen 650 MG suppository Commonly known as:  TYLENOL Replaced by:  acetaminophen 500 MG tablet   aspirin 81 MG tablet Replaced by:  aspirin 81 MG chewable tablet   HYDROcodone-acetaminophen 5-325 MG tablet Commonly known as:  NORCO/VICODIN   ibuprofen 200 MG tablet Commonly known as:  ADVIL,MOTRIN     TAKE these medications   acetaminophen 500 MG tablet Commonly  known as:  TYLENOL Take 2 tablets (1,000 mg total) by mouth every 8 (eight) hours. Replaces:  acetaminophen 650 MG suppository   aspirin 81 MG chewable tablet Chew 1 tablet (81 mg total) by mouth 2 (two) times daily. Take for 4 weeks. Replaces:  aspirin 81 MG tablet   Biotin 5000 MCG Caps Take 5,000 mcg by mouth 2 (two) times daily.   cholecalciferol 1000 units tablet Commonly known as:  VITAMIN D Take 1,000 Units by mouth daily.   docusate sodium 100 MG  capsule Commonly known as:  COLACE Take 1 capsule (100 mg total) by mouth 2 (two) times daily.   Evening Primrose Oil 1000 MG Caps Take 1,300 mg by mouth 2 (two) times daily.   HAIR/SKIN/NAILS PO Take 1 tablet by mouth daily.   HYDROmorphone 2 MG tablet Commonly known as:  DILAUDID Take 1 tablet (2 mg total) by mouth every 4 (four) hours as needed for moderate pain or severe pain.   methocarbamol 500 MG tablet Commonly known as:  ROBAXIN Take 1 tablet (500 mg total) by mouth every 6 (six) hours as needed for muscle spasms.   OVER THE COUNTER MEDICATION Take 2 tablets by mouth 2 (two) times daily. Shaklee osteomatrix- calcium supplement   OVER THE COUNTER MEDICATION Take 1 tablet by mouth daily. Nutriferon  Immune system Supplement   OVER THE COUNTER MEDICATION Take 1 tablet by mouth daily. shaklee multivitamin --strip of seven pills that you take once a day.   polyethylene glycol packet Commonly known as:  MIRALAX / GLYCOLAX Take 17 g by mouth 2 (two) times daily.   VITAMIN B12-FOLIC ACID PO Take 1 tablet by mouth daily.        Signed: [place] Pugh. Alenah Sarria   PA-C  03/06/2016, 11:22 PM

## 2016-03-09 DIAGNOSIS — S82092A Other fracture of left patella, initial encounter for closed fracture: Secondary | ICD-10-CM | POA: Diagnosis not present

## 2016-03-10 DIAGNOSIS — S82042D Displaced comminuted fracture of left patella, subsequent encounter for closed fracture with routine healing: Secondary | ICD-10-CM | POA: Diagnosis not present

## 2016-03-10 DIAGNOSIS — M810 Age-related osteoporosis without current pathological fracture: Secondary | ICD-10-CM | POA: Diagnosis not present

## 2016-03-10 DIAGNOSIS — W19XXXD Unspecified fall, subsequent encounter: Secondary | ICD-10-CM | POA: Diagnosis not present

## 2016-03-10 DIAGNOSIS — Z9181 History of falling: Secondary | ICD-10-CM | POA: Diagnosis not present

## 2016-03-12 DIAGNOSIS — S82042D Displaced comminuted fracture of left patella, subsequent encounter for closed fracture with routine healing: Secondary | ICD-10-CM | POA: Diagnosis not present

## 2016-03-12 DIAGNOSIS — M810 Age-related osteoporosis without current pathological fracture: Secondary | ICD-10-CM | POA: Diagnosis not present

## 2016-03-12 DIAGNOSIS — Z9181 History of falling: Secondary | ICD-10-CM | POA: Diagnosis not present

## 2016-03-12 DIAGNOSIS — W19XXXD Unspecified fall, subsequent encounter: Secondary | ICD-10-CM | POA: Diagnosis not present

## 2016-03-17 DIAGNOSIS — Z4789 Encounter for other orthopedic aftercare: Secondary | ICD-10-CM | POA: Diagnosis not present

## 2016-03-17 DIAGNOSIS — S82092D Other fracture of left patella, subsequent encounter for closed fracture with routine healing: Secondary | ICD-10-CM | POA: Diagnosis not present

## 2016-03-18 DIAGNOSIS — W19XXXD Unspecified fall, subsequent encounter: Secondary | ICD-10-CM | POA: Diagnosis not present

## 2016-03-18 DIAGNOSIS — S82042D Displaced comminuted fracture of left patella, subsequent encounter for closed fracture with routine healing: Secondary | ICD-10-CM | POA: Diagnosis not present

## 2016-03-18 DIAGNOSIS — Z9181 History of falling: Secondary | ICD-10-CM | POA: Diagnosis not present

## 2016-03-18 DIAGNOSIS — M810 Age-related osteoporosis without current pathological fracture: Secondary | ICD-10-CM | POA: Diagnosis not present

## 2016-03-19 DIAGNOSIS — Z4789 Encounter for other orthopedic aftercare: Secondary | ICD-10-CM | POA: Diagnosis not present

## 2016-03-19 DIAGNOSIS — S82092D Other fracture of left patella, subsequent encounter for closed fracture with routine healing: Secondary | ICD-10-CM | POA: Diagnosis not present

## 2016-03-20 DIAGNOSIS — Z9181 History of falling: Secondary | ICD-10-CM | POA: Diagnosis not present

## 2016-03-20 DIAGNOSIS — S82042D Displaced comminuted fracture of left patella, subsequent encounter for closed fracture with routine healing: Secondary | ICD-10-CM | POA: Diagnosis not present

## 2016-03-20 DIAGNOSIS — W19XXXD Unspecified fall, subsequent encounter: Secondary | ICD-10-CM | POA: Diagnosis not present

## 2016-03-20 DIAGNOSIS — M810 Age-related osteoporosis without current pathological fracture: Secondary | ICD-10-CM | POA: Diagnosis not present

## 2016-04-15 DIAGNOSIS — S82092D Other fracture of left patella, subsequent encounter for closed fracture with routine healing: Secondary | ICD-10-CM | POA: Diagnosis not present

## 2016-04-15 DIAGNOSIS — Z4789 Encounter for other orthopedic aftercare: Secondary | ICD-10-CM | POA: Diagnosis not present

## 2016-04-15 DIAGNOSIS — M25662 Stiffness of left knee, not elsewhere classified: Secondary | ICD-10-CM | POA: Diagnosis not present

## 2016-04-17 DIAGNOSIS — M25662 Stiffness of left knee, not elsewhere classified: Secondary | ICD-10-CM | POA: Diagnosis not present

## 2016-04-21 DIAGNOSIS — M25662 Stiffness of left knee, not elsewhere classified: Secondary | ICD-10-CM | POA: Diagnosis not present

## 2016-04-28 DIAGNOSIS — M25662 Stiffness of left knee, not elsewhere classified: Secondary | ICD-10-CM | POA: Diagnosis not present

## 2016-05-04 DIAGNOSIS — M25662 Stiffness of left knee, not elsewhere classified: Secondary | ICD-10-CM | POA: Diagnosis not present

## 2016-05-06 DIAGNOSIS — M25662 Stiffness of left knee, not elsewhere classified: Secondary | ICD-10-CM | POA: Diagnosis not present

## 2016-05-07 DIAGNOSIS — M25662 Stiffness of left knee, not elsewhere classified: Secondary | ICD-10-CM | POA: Diagnosis not present

## 2016-05-11 DIAGNOSIS — M25662 Stiffness of left knee, not elsewhere classified: Secondary | ICD-10-CM | POA: Diagnosis not present

## 2016-05-13 DIAGNOSIS — M25662 Stiffness of left knee, not elsewhere classified: Secondary | ICD-10-CM | POA: Diagnosis not present

## 2016-05-15 DIAGNOSIS — M25662 Stiffness of left knee, not elsewhere classified: Secondary | ICD-10-CM | POA: Diagnosis not present

## 2016-05-18 DIAGNOSIS — M25662 Stiffness of left knee, not elsewhere classified: Secondary | ICD-10-CM | POA: Diagnosis not present

## 2016-05-20 DIAGNOSIS — M25662 Stiffness of left knee, not elsewhere classified: Secondary | ICD-10-CM | POA: Diagnosis not present

## 2016-05-22 DIAGNOSIS — M25662 Stiffness of left knee, not elsewhere classified: Secondary | ICD-10-CM | POA: Diagnosis not present

## 2016-05-27 DIAGNOSIS — S82092D Other fracture of left patella, subsequent encounter for closed fracture with routine healing: Secondary | ICD-10-CM | POA: Diagnosis not present

## 2016-05-27 DIAGNOSIS — Z4789 Encounter for other orthopedic aftercare: Secondary | ICD-10-CM | POA: Diagnosis not present

## 2016-05-28 DIAGNOSIS — D1801 Hemangioma of skin and subcutaneous tissue: Secondary | ICD-10-CM | POA: Diagnosis not present

## 2016-05-28 DIAGNOSIS — M25662 Stiffness of left knee, not elsewhere classified: Secondary | ICD-10-CM | POA: Diagnosis not present

## 2016-06-02 DIAGNOSIS — M25662 Stiffness of left knee, not elsewhere classified: Secondary | ICD-10-CM | POA: Diagnosis not present

## 2016-06-05 DIAGNOSIS — M25662 Stiffness of left knee, not elsewhere classified: Secondary | ICD-10-CM | POA: Diagnosis not present

## 2016-06-08 DIAGNOSIS — M25662 Stiffness of left knee, not elsewhere classified: Secondary | ICD-10-CM | POA: Diagnosis not present

## 2016-06-10 DIAGNOSIS — M25662 Stiffness of left knee, not elsewhere classified: Secondary | ICD-10-CM | POA: Diagnosis not present

## 2016-06-19 DIAGNOSIS — M25662 Stiffness of left knee, not elsewhere classified: Secondary | ICD-10-CM | POA: Diagnosis not present

## 2016-06-22 DIAGNOSIS — M25662 Stiffness of left knee, not elsewhere classified: Secondary | ICD-10-CM | POA: Diagnosis not present

## 2016-06-24 DIAGNOSIS — M25662 Stiffness of left knee, not elsewhere classified: Secondary | ICD-10-CM | POA: Diagnosis not present

## 2016-06-26 DIAGNOSIS — M25662 Stiffness of left knee, not elsewhere classified: Secondary | ICD-10-CM | POA: Diagnosis not present

## 2016-06-29 DIAGNOSIS — M25662 Stiffness of left knee, not elsewhere classified: Secondary | ICD-10-CM | POA: Diagnosis not present

## 2016-07-01 DIAGNOSIS — M25662 Stiffness of left knee, not elsewhere classified: Secondary | ICD-10-CM | POA: Diagnosis not present

## 2016-07-02 DIAGNOSIS — S82092D Other fracture of left patella, subsequent encounter for closed fracture with routine healing: Secondary | ICD-10-CM | POA: Diagnosis not present

## 2016-07-02 DIAGNOSIS — Z4789 Encounter for other orthopedic aftercare: Secondary | ICD-10-CM | POA: Diagnosis not present

## 2016-07-06 DIAGNOSIS — M25662 Stiffness of left knee, not elsewhere classified: Secondary | ICD-10-CM | POA: Diagnosis not present

## 2016-07-09 DIAGNOSIS — M25662 Stiffness of left knee, not elsewhere classified: Secondary | ICD-10-CM | POA: Diagnosis not present

## 2016-07-13 DIAGNOSIS — M25662 Stiffness of left knee, not elsewhere classified: Secondary | ICD-10-CM | POA: Diagnosis not present

## 2016-07-16 DIAGNOSIS — M25662 Stiffness of left knee, not elsewhere classified: Secondary | ICD-10-CM | POA: Diagnosis not present

## 2016-07-23 DIAGNOSIS — M25662 Stiffness of left knee, not elsewhere classified: Secondary | ICD-10-CM | POA: Diagnosis not present

## 2016-07-27 DIAGNOSIS — M25662 Stiffness of left knee, not elsewhere classified: Secondary | ICD-10-CM | POA: Diagnosis not present

## 2016-07-29 DIAGNOSIS — M25662 Stiffness of left knee, not elsewhere classified: Secondary | ICD-10-CM | POA: Diagnosis not present

## 2016-08-03 DIAGNOSIS — M25662 Stiffness of left knee, not elsewhere classified: Secondary | ICD-10-CM | POA: Diagnosis not present

## 2016-08-05 DIAGNOSIS — S82092D Other fracture of left patella, subsequent encounter for closed fracture with routine healing: Secondary | ICD-10-CM | POA: Diagnosis not present

## 2016-08-05 DIAGNOSIS — Z4789 Encounter for other orthopedic aftercare: Secondary | ICD-10-CM | POA: Diagnosis not present

## 2016-08-14 DIAGNOSIS — M25662 Stiffness of left knee, not elsewhere classified: Secondary | ICD-10-CM | POA: Diagnosis not present

## 2016-08-17 DIAGNOSIS — M25662 Stiffness of left knee, not elsewhere classified: Secondary | ICD-10-CM | POA: Diagnosis not present

## 2016-08-19 DIAGNOSIS — M25662 Stiffness of left knee, not elsewhere classified: Secondary | ICD-10-CM | POA: Diagnosis not present

## 2016-08-25 DIAGNOSIS — M25662 Stiffness of left knee, not elsewhere classified: Secondary | ICD-10-CM | POA: Diagnosis not present

## 2016-08-27 DIAGNOSIS — M25662 Stiffness of left knee, not elsewhere classified: Secondary | ICD-10-CM | POA: Diagnosis not present

## 2016-09-09 DIAGNOSIS — S82092D Other fracture of left patella, subsequent encounter for closed fracture with routine healing: Secondary | ICD-10-CM | POA: Diagnosis not present

## 2016-09-09 DIAGNOSIS — Z4789 Encounter for other orthopedic aftercare: Secondary | ICD-10-CM | POA: Diagnosis not present

## 2016-10-14 ENCOUNTER — Encounter: Payer: Self-pay | Admitting: Gynecology

## 2016-12-14 DIAGNOSIS — E048 Other specified nontoxic goiter: Secondary | ICD-10-CM | POA: Diagnosis not present

## 2016-12-14 DIAGNOSIS — M810 Age-related osteoporosis without current pathological fracture: Secondary | ICD-10-CM | POA: Diagnosis not present

## 2016-12-14 DIAGNOSIS — E784 Other hyperlipidemia: Secondary | ICD-10-CM | POA: Diagnosis not present

## 2016-12-15 DIAGNOSIS — H2513 Age-related nuclear cataract, bilateral: Secondary | ICD-10-CM | POA: Diagnosis not present

## 2016-12-21 DIAGNOSIS — E048 Other specified nontoxic goiter: Secondary | ICD-10-CM | POA: Diagnosis not present

## 2016-12-21 DIAGNOSIS — E784 Other hyperlipidemia: Secondary | ICD-10-CM | POA: Diagnosis not present

## 2016-12-21 DIAGNOSIS — Z1389 Encounter for screening for other disorder: Secondary | ICD-10-CM | POA: Diagnosis not present

## 2016-12-21 DIAGNOSIS — R740 Nonspecific elevation of levels of transaminase and lactic acid dehydrogenase [LDH]: Secondary | ICD-10-CM | POA: Diagnosis not present

## 2016-12-21 DIAGNOSIS — Z0000 Encounter for general adult medical examination without abnormal findings: Secondary | ICD-10-CM | POA: Diagnosis not present

## 2016-12-21 DIAGNOSIS — M810 Age-related osteoporosis without current pathological fracture: Secondary | ICD-10-CM | POA: Diagnosis not present

## 2016-12-21 DIAGNOSIS — Z6820 Body mass index (BMI) 20.0-20.9, adult: Secondary | ICD-10-CM | POA: Diagnosis not present

## 2016-12-21 DIAGNOSIS — F4321 Adjustment disorder with depressed mood: Secondary | ICD-10-CM | POA: Diagnosis not present

## 2016-12-22 DIAGNOSIS — Z1212 Encounter for screening for malignant neoplasm of rectum: Secondary | ICD-10-CM | POA: Diagnosis not present

## 2016-12-30 ENCOUNTER — Encounter: Payer: Medicare Other | Admitting: Gynecology

## 2017-01-06 ENCOUNTER — Ambulatory Visit (INDEPENDENT_AMBULATORY_CARE_PROVIDER_SITE_OTHER): Payer: Medicare Other | Admitting: Gynecology

## 2017-01-06 ENCOUNTER — Encounter: Payer: Self-pay | Admitting: Gynecology

## 2017-01-06 VITALS — BP 116/76 | Ht 64.5 in | Wt 123.0 lb

## 2017-01-06 DIAGNOSIS — Z124 Encounter for screening for malignant neoplasm of cervix: Secondary | ICD-10-CM

## 2017-01-06 DIAGNOSIS — N952 Postmenopausal atrophic vaginitis: Secondary | ICD-10-CM

## 2017-01-06 DIAGNOSIS — Z01411 Encounter for gynecological examination (general) (routine) with abnormal findings: Secondary | ICD-10-CM

## 2017-01-06 DIAGNOSIS — M810 Age-related osteoporosis without current pathological fracture: Secondary | ICD-10-CM | POA: Diagnosis not present

## 2017-01-06 NOTE — Progress Notes (Signed)
    Kelly Hahn 07-20-1950 [id number]        66 y.o.  G1P1000 for breast and pelvic exam. Former patient of Dr. Toney Rakes.  Past medical history,surgical history, problem list, medications, allergies, family history and social history were all reviewed and documented as reviewed in the EPIC chart.  ROS:  Performed with pertinent positives and negatives included in the history, assessment and plan.   Additional significant findings :  None   Exam: Caryn Bee assistant Vitals:   01/06/17 1356  BP: 116/76  Weight: 123 lb (55.8 kg)  Height: 5' 4.5" (1.638 m)   Body mass index is 20.79 kg/m.  General appearance:  Normal affect, orientation and appearance. Skin: Grossly normal HEENT: Without gross lesions.  No cervical or supraclavicular adenopathy. Thyroid normal.  Lungs:  Clear without wheezing, rales or rhonchi Cardiac: RR, without RMG Abdominal:  Soft, nontender, without masses, guarding, rebound, organomegaly or hernia Breasts:  Examined lying and sitting without masses, retractions, discharge or axillary adenopathy. Pelvic:  Ext, BUS, Vagina: With atrophic changes  Cervix: With atrophic changes. Pap smear done  Uterus: Anteverted, normal size, shape and contour, midline and mobile nontender   Adnexa: Without masses or tenderness    Anus and perineum: Normal   Rectovaginal: Normal sphincter tone without palpated masses or tenderness.    Assessment/Plan:  66 y.o. G61P1000 female for breast and pelvic exam.   1. Postmenopausal/atrophic genital changes. No significant hot flushes, night sweats, vaginal dryness or any vaginal bleeding. Continue to monitor report any issues or bleeding. 2. Osteoporosis. DEXA 07/2015 at [facility] shows T score -3.3 AP spine and -2.7 total left femoral. Was extensively counseled by Dr. Toney Rakes as per his note 12/30/2015 as to treatment options and her increased fracture risk. The patient had elected not to be treated is taking supplements but  declines prescription medication. I reviewed again with her her increased fracture risk and that supplements are not considered standard of care treating osteoporosis. The patient declines treatment understanding the risks but does intend to have the bone density repeated this coming March and will schedule this. 3. Mammography 2016. Thermography 11/2016. I reviewed with the patient that thermography is not considered standard of care for routine screening. I discussed that mammogram will show different changes to detect early cancer or precancerous conditions. I strongly recommended she schedule a screening mammogram in that thermography may miss early cancers. Patient understands by recommendations and agrees to schedule mammogram this coming year. Breast exam normal today. SBE monthly reviewed. 4. Colonoscopy 2012. Reported repeat interval 10 years. 5. Pap smear 2015. Pap smear done today. No history of significant abnormal Pap smears previously. 6. Health maintenance. No routine lab work done as patient does this at Dr. Shon Baton office. Follow up in one year, sooner as needed.   Anastasio Auerbach MD, 2:29 PM 01/06/2017

## 2017-01-06 NOTE — Patient Instructions (Signed)
Call [facility] this coming January to schedule your bone density in March.  Schedule a mammogram this coming year  Follow up for GYN exam in one year

## 2017-01-07 DIAGNOSIS — Z124 Encounter for screening for malignant neoplasm of cervix: Secondary | ICD-10-CM | POA: Diagnosis not present

## 2017-01-07 NOTE — Addendum Note (Signed)
Addended by: Nelva Nay on: 01/07/2017 08:25 AM   Modules accepted: Orders

## 2017-01-11 LAB — PAP IG W/ RFLX HPV ASCU

## 2017-02-03 ENCOUNTER — Other Ambulatory Visit: Payer: Self-pay | Admitting: Orthopedic Surgery

## 2017-03-01 DIAGNOSIS — D225 Melanocytic nevi of trunk: Secondary | ICD-10-CM | POA: Diagnosis not present

## 2017-03-01 DIAGNOSIS — L570 Actinic keratosis: Secondary | ICD-10-CM | POA: Diagnosis not present

## 2017-03-15 ENCOUNTER — Telehealth: Payer: Self-pay | Admitting: *Deleted

## 2017-03-15 NOTE — Telephone Encounter (Signed)
Pt called requesting dexa order faxed to [facility], dexa scheduled on 08/16/17

## 2017-04-14 DIAGNOSIS — F418 Other specified anxiety disorders: Secondary | ICD-10-CM | POA: Diagnosis not present

## 2017-04-14 DIAGNOSIS — Z6821 Body mass index (BMI) 21.0-21.9, adult: Secondary | ICD-10-CM | POA: Diagnosis not present

## 2017-04-14 DIAGNOSIS — R740 Nonspecific elevation of levels of transaminase and lactic acid dehydrogenase [LDH]: Secondary | ICD-10-CM | POA: Diagnosis not present

## 2017-04-14 DIAGNOSIS — R1312 Dysphagia, oropharyngeal phase: Secondary | ICD-10-CM | POA: Diagnosis not present

## 2017-04-19 ENCOUNTER — Other Ambulatory Visit (HOSPITAL_COMMUNITY): Payer: Self-pay | Admitting: Internal Medicine

## 2017-04-19 DIAGNOSIS — R1310 Dysphagia, unspecified: Secondary | ICD-10-CM

## 2017-04-30 ENCOUNTER — Other Ambulatory Visit (HOSPITAL_COMMUNITY): Payer: Self-pay | Admitting: Internal Medicine

## 2017-04-30 ENCOUNTER — Ambulatory Visit (HOSPITAL_COMMUNITY)
Admission: RE | Admit: 2017-04-30 | Discharge: 2017-04-30 | Disposition: A | Discharge status: Home / Self Care | Payer: Medicare Other | Source: Ambulatory Visit | Attending: Internal Medicine | Admitting: Internal Medicine

## 2017-04-30 ENCOUNTER — Ambulatory Visit (HOSPITAL_COMMUNITY): Payer: Medicare Other

## 2017-04-30 DIAGNOSIS — R1310 Dysphagia, unspecified: Secondary | ICD-10-CM

## 2017-04-30 DIAGNOSIS — K222 Esophageal obstruction: Secondary | ICD-10-CM | POA: Insufficient documentation

## 2017-04-30 DIAGNOSIS — K219 Gastro-esophageal reflux disease without esophagitis: Secondary | ICD-10-CM | POA: Diagnosis not present

## 2017-04-30 DIAGNOSIS — K449 Diaphragmatic hernia without obstruction or gangrene: Secondary | ICD-10-CM | POA: Insufficient documentation

## 2017-07-07 DIAGNOSIS — L918 Other hypertrophic disorders of the skin: Secondary | ICD-10-CM | POA: Diagnosis not present

## 2017-07-07 DIAGNOSIS — L570 Actinic keratosis: Secondary | ICD-10-CM | POA: Diagnosis not present

## 2017-07-07 DIAGNOSIS — D235 Other benign neoplasm of skin of trunk: Secondary | ICD-10-CM | POA: Diagnosis not present

## 2017-07-07 DIAGNOSIS — D3612 Benign neoplasm of peripheral nerves and autonomic nervous system, upper limb, including shoulder: Secondary | ICD-10-CM | POA: Diagnosis not present

## 2017-07-07 DIAGNOSIS — D485 Neoplasm of uncertain behavior of skin: Secondary | ICD-10-CM | POA: Diagnosis not present

## 2017-07-26 ENCOUNTER — Encounter: Payer: Self-pay | Admitting: Gynecology

## 2017-07-26 DIAGNOSIS — Z1231 Encounter for screening mammogram for malignant neoplasm of breast: Secondary | ICD-10-CM | POA: Diagnosis not present

## 2017-08-16 DIAGNOSIS — M8589 Other specified disorders of bone density and structure, multiple sites: Secondary | ICD-10-CM | POA: Diagnosis not present

## 2017-08-16 DIAGNOSIS — M810 Age-related osteoporosis without current pathological fracture: Secondary | ICD-10-CM | POA: Diagnosis not present

## 2017-08-20 ENCOUNTER — Encounter: Payer: Self-pay | Admitting: Gynecology

## 2017-08-23 ENCOUNTER — Telehealth: Payer: Self-pay | Admitting: Gynecology

## 2017-08-23 ENCOUNTER — Encounter: Payer: Self-pay | Admitting: Gynecology

## 2017-08-23 NOTE — Telephone Encounter (Signed)
Left message for pt to call.

## 2017-08-23 NOTE — Telephone Encounter (Signed)
Tell patient her most recent bone density continues to show osteoporosis.  It does not show significant loss from her prior study but again clearly shows osteoporosis and regardless puts her at a higher fracture risk.  My recommendation would be to consider starting medication.  Recommend office visit if she wants to discuss medication choices.

## 2017-09-03 NOTE — Telephone Encounter (Signed)
Pt informed, states she is walking daily at least 20 mins per day, she declined treatment at the time. Will call to follow up if she wants to start medication

## 2017-12-20 DIAGNOSIS — R82998 Other abnormal findings in urine: Secondary | ICD-10-CM | POA: Diagnosis not present

## 2017-12-20 DIAGNOSIS — E048 Other specified nontoxic goiter: Secondary | ICD-10-CM | POA: Diagnosis not present

## 2017-12-20 DIAGNOSIS — E7849 Other hyperlipidemia: Secondary | ICD-10-CM | POA: Diagnosis not present

## 2017-12-20 DIAGNOSIS — M810 Age-related osteoporosis without current pathological fracture: Secondary | ICD-10-CM | POA: Diagnosis not present

## 2017-12-27 DIAGNOSIS — R1312 Dysphagia, oropharyngeal phase: Secondary | ICD-10-CM | POA: Diagnosis not present

## 2017-12-27 DIAGNOSIS — Z0000 Encounter for general adult medical examination without abnormal findings: Secondary | ICD-10-CM | POA: Diagnosis not present

## 2017-12-27 DIAGNOSIS — M810 Age-related osteoporosis without current pathological fracture: Secondary | ICD-10-CM | POA: Diagnosis not present

## 2017-12-27 DIAGNOSIS — E048 Other specified nontoxic goiter: Secondary | ICD-10-CM | POA: Diagnosis not present

## 2017-12-27 DIAGNOSIS — Z6820 Body mass index (BMI) 20.0-20.9, adult: Secondary | ICD-10-CM | POA: Diagnosis not present

## 2017-12-27 DIAGNOSIS — Z1389 Encounter for screening for other disorder: Secondary | ICD-10-CM | POA: Diagnosis not present

## 2017-12-30 ENCOUNTER — Telehealth: Payer: Self-pay

## 2017-12-30 NOTE — Telephone Encounter (Signed)
Patient called in voice mail stating she just found out she was supposed to have colonoscopy in 2017. She reportedly never received a reminder letter and has a family hx of colon cancer.  She said she wanted to have it done before she sees Dr. Loetta Rough for CE on 8/28 but also is going to be gone away from upcoming [facility] -Aug 18. She asked [place] to call and "help her get in".

## 2017-12-30 NOTE — Telephone Encounter (Signed)
I called over and talked with Dr. Osborn Coho receptionist. The patient had already called them about scheduling and they have sent a note back to the nurse to arrange this.  Receptionist said they have documented a letter was send 2011 and in 2017. She verified correct address with patient.  At this point I am really unable to assist patient. I will wait a few days and give their office time to help her in the order they do things.  I will check with her first of next week. Not calling her today.

## 2018-01-04 NOTE — Telephone Encounter (Signed)
I spoke with Dr. Jacqlyn Larsen receptionist. Patient is scheduled for 01/24/18 at 11:00am for her colonoscopy.

## 2018-01-21 ENCOUNTER — Encounter: Payer: Medicare Other | Admitting: Gynecology

## 2018-01-26 ENCOUNTER — Ambulatory Visit (INDEPENDENT_AMBULATORY_CARE_PROVIDER_SITE_OTHER): Payer: Medicare Other | Admitting: Gynecology

## 2018-01-26 ENCOUNTER — Encounter: Payer: Self-pay | Admitting: Gynecology

## 2018-01-26 VITALS — BP 118/74 | Ht 64.5 in | Wt 122.0 lb

## 2018-01-26 DIAGNOSIS — Z01419 Encounter for gynecological examination (general) (routine) without abnormal findings: Secondary | ICD-10-CM | POA: Diagnosis not present

## 2018-01-26 DIAGNOSIS — N952 Postmenopausal atrophic vaginitis: Secondary | ICD-10-CM

## 2018-01-26 DIAGNOSIS — M810 Age-related osteoporosis without current pathological fracture: Secondary | ICD-10-CM

## 2018-01-26 NOTE — Progress Notes (Signed)
    BECCI BATTY 1950/09/18 [id number]        67 y.o.  G1P1000 for breast and pelvic exam.  Doing well without complaints.  Past medical history,surgical history, problem list, medications, allergies, family history and social history were all reviewed and documented as reviewed in the EPIC chart.  ROS:  Performed with pertinent positives and negatives included in the history, assessment and plan.   Additional significant findings : None   Exam: Caryn Bee assistant Vitals:   01/26/18 1406  BP: 118/74  Weight: 122 lb (55.3 kg)  Height: 5' 4.5" (1.638 m)   Body mass index is 20.62 kg/m.  General appearance:  Normal affect, orientation and appearance. Skin: Grossly normal HEENT: Without gross lesions.  No cervical or supraclavicular adenopathy. Thyroid normal.  Lungs:  Clear without wheezing, rales or rhonchi Cardiac: RR, without RMG Abdominal:  Soft, nontender, without masses, guarding, rebound, organomegaly or hernia Breasts:  Examined lying and sitting without masses, retractions, discharge or axillary adenopathy. Pelvic:  Ext, BUS, Vagina: With atrophic changes  Cervix: With atrophic changes  Uterus: Anteverted, normal size, shape and contour, midline and mobile nontender   Adnexa: Without masses or tenderness    Anus and perineum: Normal   Rectovaginal: Normal sphincter tone without palpated masses or tenderness.    Assessment/Plan:  67 y.o. G42P1000 female for breast and pelvic exam.   1. Postmenopausal.  Without significant menopausal symptoms or any vaginal bleeding. 2. Osteoporosis.  Recent DEXA 2019 T score -3.0.  Stable from her prior DEXA.  We have discussed treatment options in the past and her increased fracture risk.  Patient actively pursues weightbearing exercise as well as calcium and vitamin D supplementation.  She declines medication now understanding her increased fracture risk.  We will plan on repeat DEXA in 2 years. 3. Mammography 07/2017.   Continue with annual mammography when due.  Breast exam normal today. 4. Pap smear 2018.  No Pap smear done today.  No history of significant abnormal Pap smears.  Options to stop screening per current screening guidelines reviewed.  Will readdress on an annual basis. 5. Colonoscopy 2012.  Patient has colonoscopy scheduled this coming week. 6. Health maintenance.  No routine lab work done as patient does this elsewhere.  Follow-up 1 year, sooner as needed.   Anastasio Auerbach MD, 2:33 PM 01/26/2018

## 2018-01-26 NOTE — Patient Instructions (Signed)
Follow-up in 1 year for annual exam, sooner if any issues. 

## 2018-02-08 DIAGNOSIS — Z86010 Personal history of colonic polyps: Secondary | ICD-10-CM | POA: Diagnosis not present

## 2018-02-15 DIAGNOSIS — L570 Actinic keratosis: Secondary | ICD-10-CM | POA: Diagnosis not present

## 2018-02-15 DIAGNOSIS — L820 Inflamed seborrheic keratosis: Secondary | ICD-10-CM | POA: Diagnosis not present

## 2018-02-15 DIAGNOSIS — L821 Other seborrheic keratosis: Secondary | ICD-10-CM | POA: Diagnosis not present

## 2018-04-14 DIAGNOSIS — L03011 Cellulitis of right finger: Secondary | ICD-10-CM | POA: Diagnosis not present

## 2018-04-15 DIAGNOSIS — Z23 Encounter for immunization: Secondary | ICD-10-CM | POA: Diagnosis not present

## 2018-06-08 DIAGNOSIS — D1801 Hemangioma of skin and subcutaneous tissue: Secondary | ICD-10-CM | POA: Diagnosis not present

## 2018-07-11 DIAGNOSIS — H6983 Other specified disorders of Eustachian tube, bilateral: Secondary | ICD-10-CM | POA: Diagnosis not present

## 2018-07-11 DIAGNOSIS — Z6821 Body mass index (BMI) 21.0-21.9, adult: Secondary | ICD-10-CM | POA: Diagnosis not present

## 2018-07-11 DIAGNOSIS — H6691 Otitis media, unspecified, right ear: Secondary | ICD-10-CM | POA: Diagnosis not present

## 2018-07-29 DIAGNOSIS — H903 Sensorineural hearing loss, bilateral: Secondary | ICD-10-CM | POA: Diagnosis not present

## 2018-08-01 ENCOUNTER — Encounter: Payer: Self-pay | Admitting: Gynecology

## 2018-08-01 DIAGNOSIS — Z1231 Encounter for screening mammogram for malignant neoplasm of breast: Secondary | ICD-10-CM | POA: Diagnosis not present

## 2018-09-10 IMAGING — CR DG KNEE COMPLETE 4+V*L*
4 series · 4 of 4 positions shown · non-contrast
Comparison: None.

CLINICAL DATA: Slip and fall injury. Deformity, swelling of the
anterior knee.

EXAM:
LEFT KNEE - COMPLETE 4+ VIEW

[x knee ap left (1 of 3)]
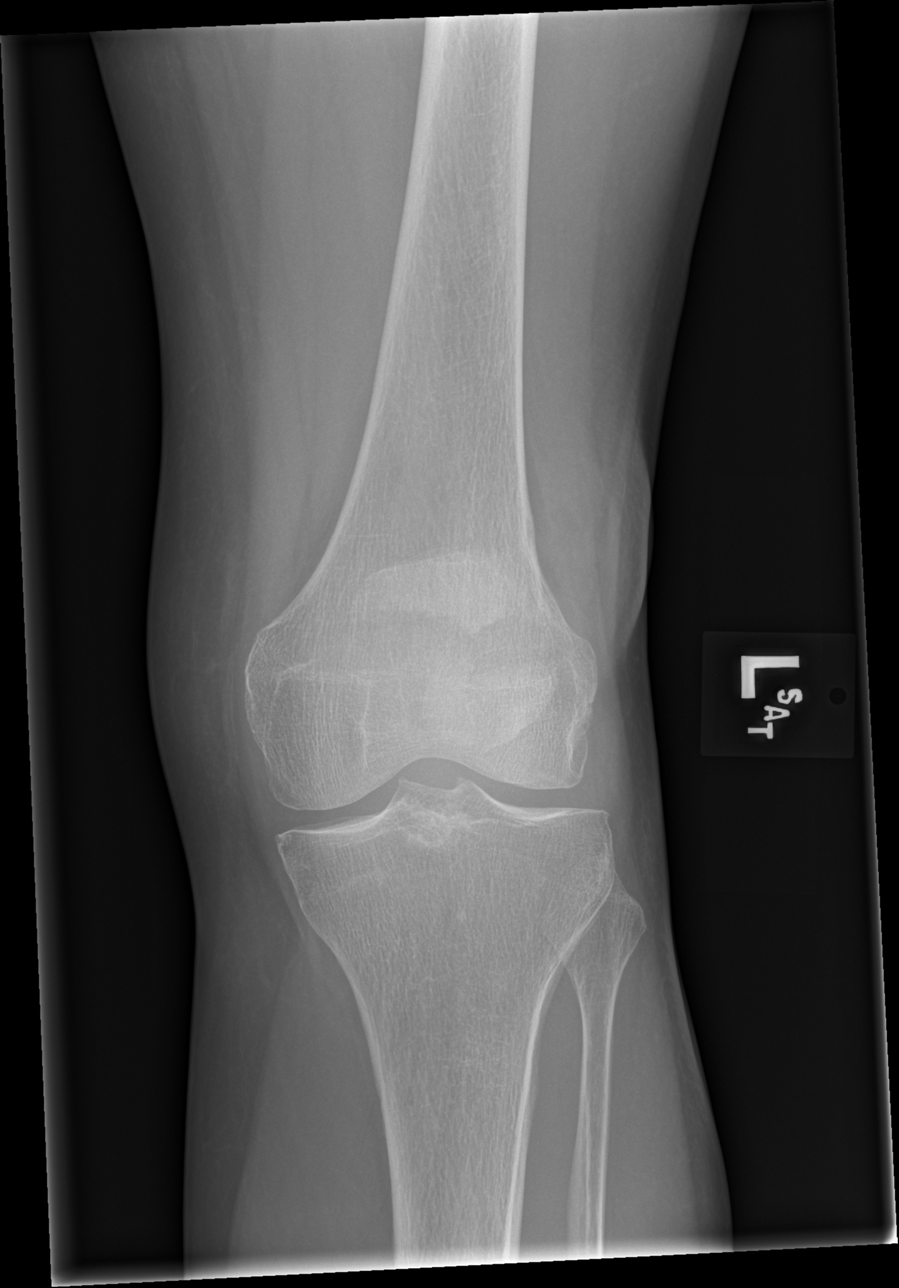

[x knee ap left (2 of 3)]
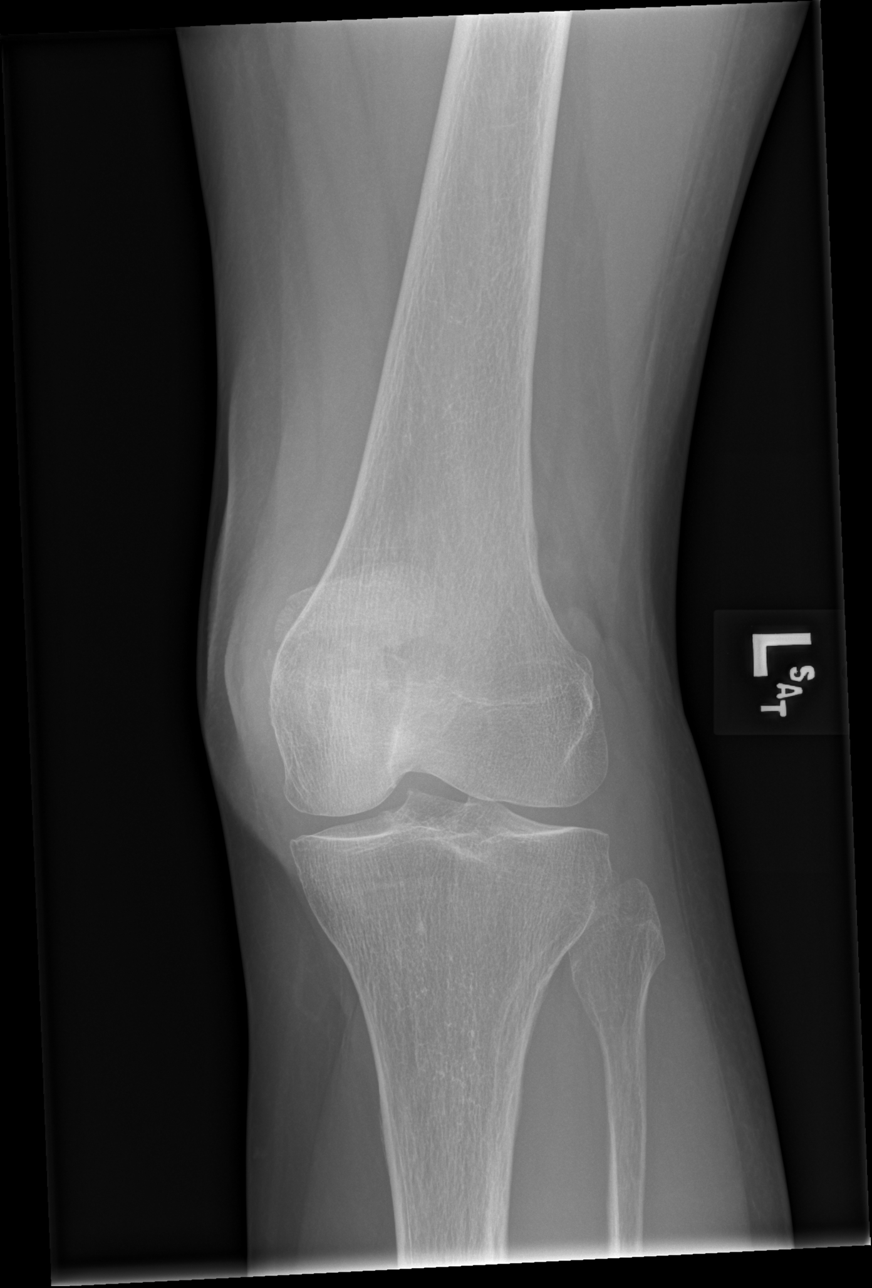

[x knee ap left (3 of 3)]
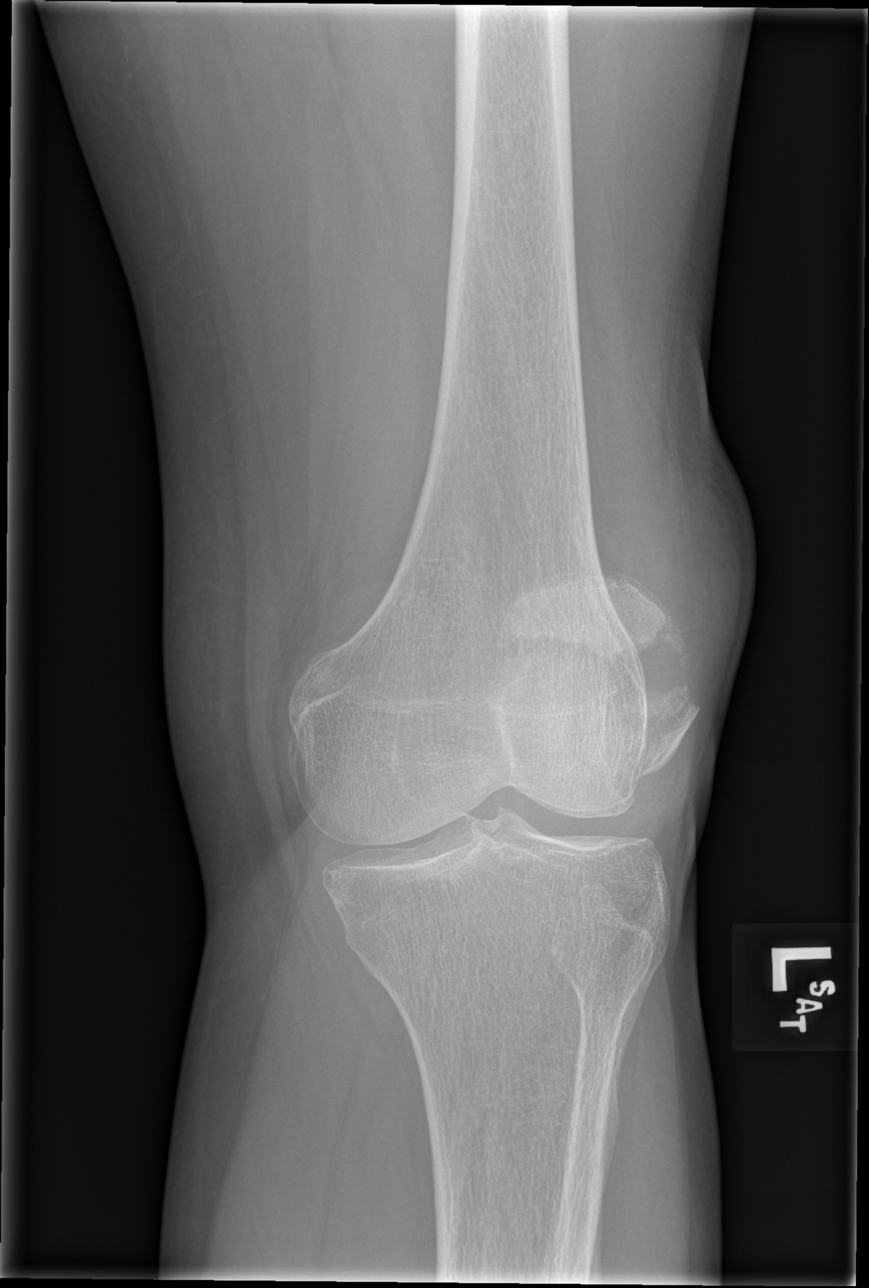

[x knee lat left]
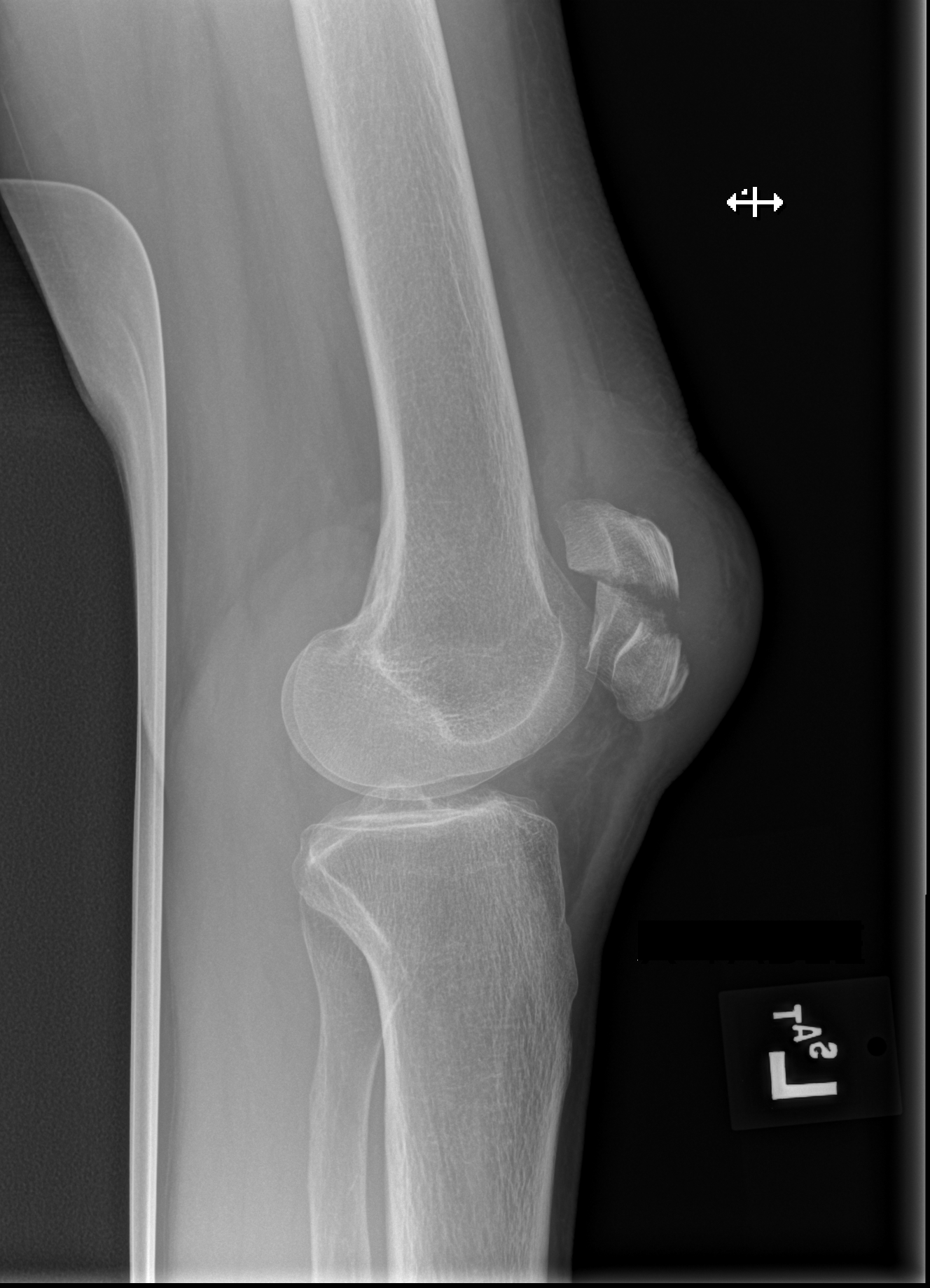

[4 of 4 positions shown; findings below may reference images not displayed]

FINDINGS: Transverse mildly comminuted fracture through the body of the
patella with distraction of the fracture fragments and angulation of
the distal fracture fragment. This results in approximately 6 mm
cortical step-off at the articular surface. There is associated soft
tissue swelling over the prepatellar space. No significant effusion.
Mild degenerative changes in the left knee with mild narrowing and
osteophyte formation of the medial compartment. No additional
fractures or focal bone lesions identified.
IMPRESSION: Transverse fracture through the body of the patella with distraction
of fracture fragments.

## 2018-09-14 IMAGING — RF DG KNEE 1-2V*L*
1 series · 2 of 2 positions shown · non-contrast
Comparison: February 28, 2016

FLUOROSCOPY TIME:  1 minutes 18 seconds; 2 acquired images. 1.66 mGy

CLINICAL DATA: Open reduction internal fixation for patellar
fracture

EXAM:
DG C-ARM 61-120 MIN-NO REPORT; LEFT KNEE - 1-2 VIEW

[Series 1: run · 2 of 2 slices shown]
[im 1/2]
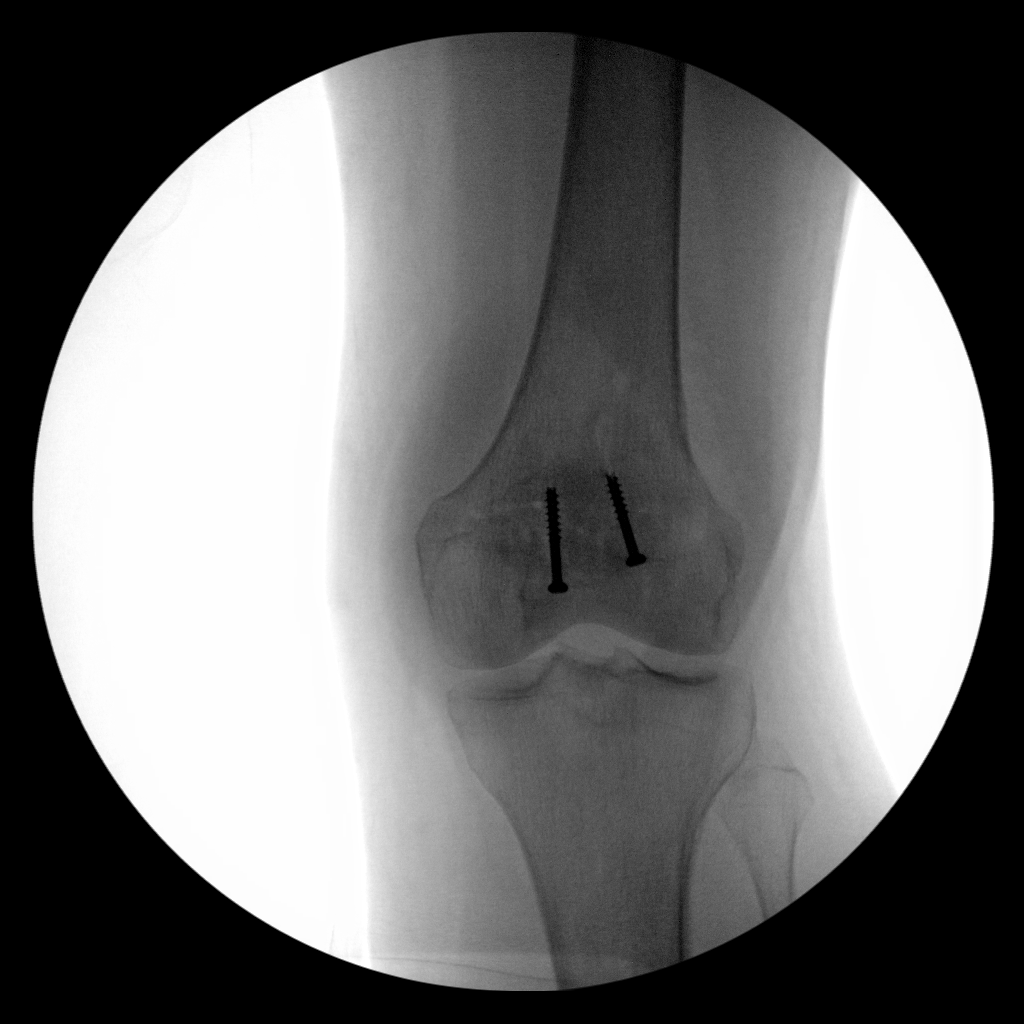
[im 2/2]
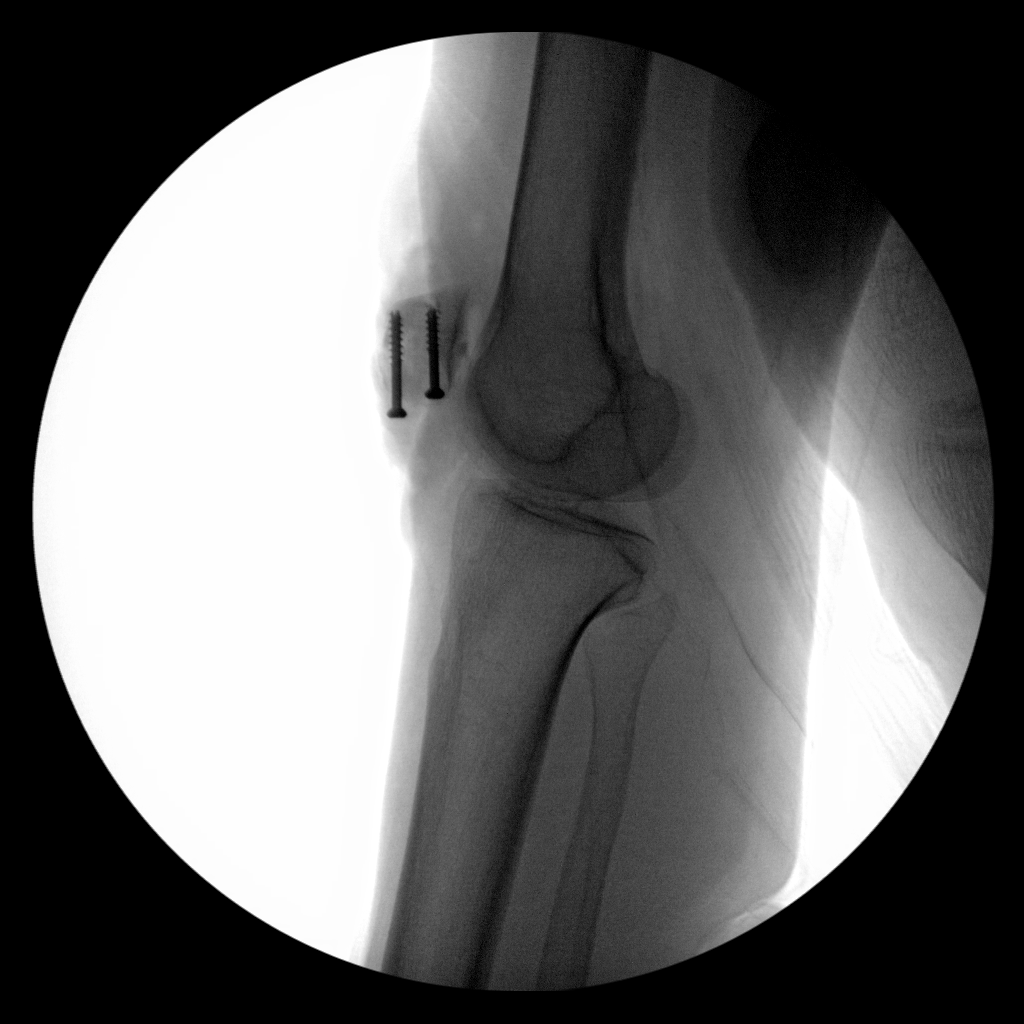

[2 of 2 positions shown; findings below may reference images not displayed]

FINDINGS: Frontal and lateral view show screw fixation through a fracture of
the patella. Alignment is essentially anatomic at the fracture site
following screw placement. No new fracture or dislocation. No
evident joint effusion. Joint spaces appear normal.
IMPRESSION: Screw fixation through patellar fracture with alignment essentially
anatomic at the fracture site. No dislocation. Joint spaces
unremarkable.

## 2018-12-23 DIAGNOSIS — M810 Age-related osteoporosis without current pathological fracture: Secondary | ICD-10-CM | POA: Diagnosis not present

## 2018-12-23 DIAGNOSIS — E7849 Other hyperlipidemia: Secondary | ICD-10-CM | POA: Diagnosis not present

## 2018-12-26 DIAGNOSIS — R82998 Other abnormal findings in urine: Secondary | ICD-10-CM | POA: Diagnosis not present

## 2018-12-30 DIAGNOSIS — Z1339 Encounter for screening examination for other mental health and behavioral disorders: Secondary | ICD-10-CM | POA: Diagnosis not present

## 2018-12-30 DIAGNOSIS — Z0000 Encounter for general adult medical examination without abnormal findings: Secondary | ICD-10-CM | POA: Diagnosis not present

## 2018-12-30 DIAGNOSIS — E785 Hyperlipidemia, unspecified: Secondary | ICD-10-CM | POA: Diagnosis not present

## 2018-12-30 DIAGNOSIS — Z1331 Encounter for screening for depression: Secondary | ICD-10-CM | POA: Diagnosis not present

## 2018-12-30 DIAGNOSIS — M810 Age-related osteoporosis without current pathological fracture: Secondary | ICD-10-CM | POA: Diagnosis not present

## 2018-12-30 DIAGNOSIS — E049 Nontoxic goiter, unspecified: Secondary | ICD-10-CM | POA: Diagnosis not present

## 2019-01-27 ENCOUNTER — Other Ambulatory Visit: Payer: Self-pay

## 2019-01-30 ENCOUNTER — Encounter: Payer: Self-pay | Admitting: Gynecology

## 2019-01-30 ENCOUNTER — Other Ambulatory Visit: Payer: Self-pay

## 2019-01-30 ENCOUNTER — Ambulatory Visit (INDEPENDENT_AMBULATORY_CARE_PROVIDER_SITE_OTHER): Payer: Medicare Other | Admitting: Gynecology

## 2019-01-30 VITALS — BP 112/70 | Ht 64.5 in | Wt 120.0 lb

## 2019-01-30 DIAGNOSIS — Z01419 Encounter for gynecological examination (general) (routine) without abnormal findings: Secondary | ICD-10-CM | POA: Diagnosis not present

## 2019-01-30 DIAGNOSIS — M810 Age-related osteoporosis without current pathological fracture: Secondary | ICD-10-CM

## 2019-01-30 DIAGNOSIS — N952 Postmenopausal atrophic vaginitis: Secondary | ICD-10-CM

## 2019-01-30 NOTE — Progress Notes (Signed)
    Kelly Hahn [birth year] [id number]        68 y.o.  G1P1000 for breast and pelvic exam  Past medical history,surgical history, problem list, medications, allergies, family history and social history were all reviewed and documented as reviewed in the EPIC chart.  ROS:  Performed with pertinent positives and negatives included in the history, assessment and plan.   Additional significant findings : None   Exam: Caryn Bee assistant Vitals:   01/30/19 1026  BP: 112/70  Weight: 120 lb (54.4 kg)  Height: 5' 4.5" (1.638 m)   Body mass index is 20.28 kg/m.  General appearance:  Normal affect, orientation and appearance. Skin: Grossly normal HEENT: Without gross lesions.  No cervical or supraclavicular adenopathy. Thyroid normal.  Lungs:  Clear without wheezing, rales or rhonchi Cardiac: RR, without RMG Abdominal:  Soft, nontender, without masses, guarding, rebound, organomegaly or hernia Breasts:  Examined lying and sitting without masses, retractions, discharge or axillary adenopathy. Pelvic:  Ext, BUS, Vagina: With atrophic changes  Cervix: With atrophic changes  Uterus: Anteverted, normal size, shape and contour, midline and mobile nontender   Adnexa: Without masses or tenderness    Anus and perineum: Normal   Rectovaginal: Normal sphincter tone without palpated masses or tenderness.    Assessment/Plan:  68 y.o. G22P1000 female for breast and pelvic exam  1. Postmenopausal/atrophic gentle changes.  No significant menopausal symptoms or any vaginal bleeding. 2. Osteoporosis.  DEXA 2019 T score -3 stable from prior DEXA.  We discussed previously and again today her osteoporosis and medication options.  The patient declines medications.  She is actively involved in weightbearing exercise prefers no medication at this time.  We will plan on repeating her DEXA next year at 2-year interval. 3. Mammography 07/2018.  Continue with annual mammography when due.  Breast exam  normal today. 4. Pap smear 2018.  No Pap smear done today.  No history of abnormal Pap smears.  Options to stop screening per current screening guidelines reviewed.  Will readdress on annual basis. 5. Colonoscopy 2019.  Repeat at their recommended interval. 6. Health maintenance.  No routine lab work done as patient does this elsewhere.  Follow-up 1 year, sooner as needed.   Anastasio Auerbach MD, 10:59 AM 01/30/2019

## 2019-01-30 NOTE — Patient Instructions (Signed)
Follow-up in 1 year for annual exam, sooner as needed. 

## 2019-03-07 DIAGNOSIS — R05 Cough: Secondary | ICD-10-CM | POA: Diagnosis not present

## 2019-03-07 DIAGNOSIS — Z20818 Contact with and (suspected) exposure to other bacterial communicable diseases: Secondary | ICD-10-CM | POA: Diagnosis not present

## 2019-03-07 DIAGNOSIS — J029 Acute pharyngitis, unspecified: Secondary | ICD-10-CM | POA: Diagnosis not present

## 2019-03-08 ENCOUNTER — Encounter: Payer: Self-pay | Admitting: Gynecology

## 2019-03-23 DIAGNOSIS — H2513 Age-related nuclear cataract, bilateral: Secondary | ICD-10-CM | POA: Diagnosis not present

## 2019-06-20 ENCOUNTER — Ambulatory Visit: Payer: Medicare Other | Attending: Internal Medicine

## 2019-06-20 DIAGNOSIS — Z23 Encounter for immunization: Secondary | ICD-10-CM | POA: Diagnosis not present

## 2019-06-20 NOTE — Progress Notes (Signed)
   Covid-19 Vaccination Clinic  Name:  Kelly Hahn    MRN: [id number] DOB: Jan 16, 1951  06/20/2019  Kelly Hahn was observed post Covid-19 immunization for 15 minutes without incidence. She was provided with Vaccine Information Sheet and instruction to access the V-Safe system.   Kelly Hahn was instructed to call 911 with any severe reactions post vaccine: Marland Kitchen Difficulty breathing  . Swelling of your face and throat  . A fast heartbeat  . A bad rash all over your body  . Dizziness and weakness    Immunizations Administered    Name Date Dose VIS Date Route   Pfizer COVID-19 Vaccine 06/20/2019  3:05 PM 0.3 mL 05/12/2019 Intramuscular   Manufacturer: [place]   Lot: [id number]   [place]: [id number]

## 2019-07-10 ENCOUNTER — Ambulatory Visit: Payer: Medicare Other | Attending: Internal Medicine

## 2019-07-10 DIAGNOSIS — Z23 Encounter for immunization: Secondary | ICD-10-CM | POA: Insufficient documentation

## 2019-07-10 NOTE — Progress Notes (Signed)
   Covid-19 Vaccination Clinic  Name:  Kelly Hahn    MRN: [id number] DOB: [birth year]  07/10/2019  [name] was observed post Covid-19 immunization for 15 minutes without incidence. She was provided with Vaccine Information Sheet and instruction to access the V-Safe system.   [name] was instructed to call 911 with any severe reactions post vaccine: Marland Kitchen Difficulty breathing  . Swelling of your face and throat  . A fast heartbeat  . A bad rash all over your body  . Dizziness and weakness    Immunizations Administered    Name Date Dose VIS Date Route   Pfizer COVID-19 Vaccine 07/10/2019  4:46 PM 0.3 mL 05/12/2019 Intramuscular   Manufacturer: [place]   Lot: [id number]   [place]: [id number]

## 2019-07-31 DIAGNOSIS — M79642 Pain in left hand: Secondary | ICD-10-CM | POA: Diagnosis not present

## 2019-07-31 DIAGNOSIS — S60222A Contusion of left hand, initial encounter: Secondary | ICD-10-CM | POA: Diagnosis not present

## 2019-08-07 DIAGNOSIS — S62317A Displaced fracture of base of fifth metacarpal bone. left hand, initial encounter for closed fracture: Secondary | ICD-10-CM | POA: Diagnosis not present

## 2019-08-07 DIAGNOSIS — M79642 Pain in left hand: Secondary | ICD-10-CM | POA: Diagnosis not present

## 2019-08-31 DIAGNOSIS — S62317A Displaced fracture of base of fifth metacarpal bone. left hand, initial encounter for closed fracture: Secondary | ICD-10-CM | POA: Diagnosis not present

## 2019-09-18 DIAGNOSIS — S62317A Displaced fracture of base of fifth metacarpal bone. left hand, initial encounter for closed fracture: Secondary | ICD-10-CM | POA: Diagnosis not present

## 2019-09-18 DIAGNOSIS — M79642 Pain in left hand: Secondary | ICD-10-CM | POA: Diagnosis not present

## 2019-09-29 DIAGNOSIS — Z1231 Encounter for screening mammogram for malignant neoplasm of breast: Secondary | ICD-10-CM | POA: Diagnosis not present

## 2019-10-17 ENCOUNTER — Other Ambulatory Visit (HOSPITAL_COMMUNITY): Payer: Self-pay | Admitting: Nephrology

## 2019-10-17 ENCOUNTER — Other Ambulatory Visit: Payer: Self-pay | Admitting: Nephrology

## 2019-10-17 DIAGNOSIS — R1313 Dysphagia, pharyngeal phase: Secondary | ICD-10-CM

## 2019-10-25 ENCOUNTER — Other Ambulatory Visit: Payer: Self-pay

## 2019-10-25 ENCOUNTER — Ambulatory Visit (HOSPITAL_COMMUNITY)
Admission: RE | Admit: 2019-10-25 | Discharge: 2019-10-25 | Disposition: A | Discharge status: Home / Self Care | Payer: Medicare Other | Source: Ambulatory Visit | Attending: Nephrology | Admitting: Nephrology

## 2019-10-25 DIAGNOSIS — R1313 Dysphagia, pharyngeal phase: Secondary | ICD-10-CM

## 2019-10-25 DIAGNOSIS — K224 Dyskinesia of esophagus: Secondary | ICD-10-CM | POA: Diagnosis not present

## 2019-11-10 DIAGNOSIS — R1313 Dysphagia, pharyngeal phase: Secondary | ICD-10-CM | POA: Diagnosis not present

## 2019-11-10 DIAGNOSIS — F458 Other somatoform disorders: Secondary | ICD-10-CM | POA: Diagnosis not present

## 2019-11-10 DIAGNOSIS — K224 Dyskinesia of esophagus: Secondary | ICD-10-CM | POA: Diagnosis not present

## 2019-11-16 ENCOUNTER — Other Ambulatory Visit (HOSPITAL_COMMUNITY): Payer: Self-pay | Admitting: *Deleted

## 2019-11-16 DIAGNOSIS — R1310 Dysphagia, unspecified: Secondary | ICD-10-CM

## 2019-11-16 DIAGNOSIS — R059 Cough, unspecified: Secondary | ICD-10-CM

## 2019-11-28 ENCOUNTER — Ambulatory Visit (HOSPITAL_COMMUNITY)
Admission: RE | Admit: 2019-11-28 | Discharge: 2019-11-28 | Disposition: A | Discharge status: Home / Self Care | Payer: Medicare Other | Source: Ambulatory Visit | Attending: Physician Assistant | Admitting: Physician Assistant

## 2019-11-28 ENCOUNTER — Other Ambulatory Visit: Payer: Self-pay

## 2019-11-28 DIAGNOSIS — R1310 Dysphagia, unspecified: Secondary | ICD-10-CM | POA: Diagnosis present

## 2019-11-28 DIAGNOSIS — R05 Cough: Secondary | ICD-10-CM | POA: Diagnosis present

## 2019-11-28 DIAGNOSIS — R059 Cough, unspecified: Secondary | ICD-10-CM

## 2020-01-19 DIAGNOSIS — R1313 Dysphagia, pharyngeal phase: Secondary | ICD-10-CM | POA: Diagnosis not present

## 2020-02-08 ENCOUNTER — Encounter: Payer: Medicare Other | Admitting: Obstetrics and Gynecology

## 2020-02-26 DIAGNOSIS — H11121 Conjunctival concretions, right eye: Secondary | ICD-10-CM | POA: Diagnosis not present

## 2020-02-26 DIAGNOSIS — H02882 Meibomian gland dysfunction right lower eyelid: Secondary | ICD-10-CM | POA: Diagnosis not present

## 2020-03-14 ENCOUNTER — Other Ambulatory Visit: Payer: Self-pay

## 2020-03-14 DIAGNOSIS — Z20822 Contact with and (suspected) exposure to covid-19: Secondary | ICD-10-CM | POA: Insufficient documentation

## 2020-03-14 DIAGNOSIS — J069 Acute upper respiratory infection, unspecified: Secondary | ICD-10-CM | POA: Diagnosis not present

## 2020-03-14 DIAGNOSIS — R509 Fever, unspecified: Secondary | ICD-10-CM | POA: Diagnosis present

## 2020-03-15 ENCOUNTER — Emergency Department (HOSPITAL_BASED_OUTPATIENT_CLINIC_OR_DEPARTMENT_OTHER)
Admission: EM | Admit: 2020-03-15 | Discharge: 2020-03-15 | Disposition: A | Discharge status: Home / Self Care | Payer: Medicare Other | Attending: Emergency Medicine | Admitting: Emergency Medicine

## 2020-03-15 ENCOUNTER — Encounter (HOSPITAL_BASED_OUTPATIENT_CLINIC_OR_DEPARTMENT_OTHER): Payer: Self-pay | Admitting: Emergency Medicine

## 2020-03-15 DIAGNOSIS — Z20822 Contact with and (suspected) exposure to covid-19: Secondary | ICD-10-CM

## 2020-03-15 DIAGNOSIS — J069 Acute upper respiratory infection, unspecified: Secondary | ICD-10-CM | POA: Diagnosis not present

## 2020-03-15 LAB — RESPIRATORY PANEL BY RT PCR (FLU A&B, COVID)
Influenza A by PCR: NEGATIVE
Influenza B by PCR: NEGATIVE
SARS Coronavirus 2 by RT PCR: NEGATIVE

## 2020-03-15 MED ORDER — ACETAMINOPHEN 500 MG PO TABS
1000.0000 mg | ORAL_TABLET | Freq: Once | ORAL | Status: AC
  Administered 2020-03-15: 1000 mg via ORAL
  Filled 2020-03-15: qty 2

## 2020-03-15 NOTE — ED Triage Notes (Signed)
Patient presents with complaints of fever, cough, runny nose, nasal congestion and general malaise. States onset today. Denies taking any medications.

## 2020-03-15 NOTE — ED Provider Notes (Signed)
[facility] Provider Note: Georgena Spurling, MD, FACEP  CSN: [id number] MRN: [id number] ARRIVAL: 03/14/20 at 2355 ROOM: [place]  Fever   HISTORY OF PRESENT ILLNESS  03/15/20 2:11 AM Kelly Hahn is a 69 y.o. female with subjective fever, nasal congestion, scratchy throat, general malaise and cough since yesterday.  She did not take her temperature at home and she did not take any medication for it.  Symptoms are moderate. She has had the Covid vaccine.  She denies loss of taste or smell.  She denies shortness of breath.  She denies nausea, vomiting or diarrhea.  Past Medical History:  Diagnosis Date  . Anemia   . Endometriosis   . Fall   . Fracture of arm   . Heart murmur   . High cholesterol   . Osteoporosis 07/2017   T score -3.0  . Palpitations   . Schatzki's ring   . Thyroid disease    Nodules    Past Surgical History:  Procedure Laterality Date  . CESAREAN SECTION    . ORIF PATELLA Left 03/03/2016   Procedure: OPEN REDUCTION INTERNAL (ORIF) FIXATION PATELLA;  Surgeon: Paralee Cancel, MD;  Location: [facility];  Service: Orthopedics;  Laterality: Left;  . PELVIC LAPAROSCOPY  1989  . tummy tuck      Family History  Problem Relation Age of Onset  . Hypertension Mother   . Diabetes Mother   . Osteoarthritis Mother   . Heart disease Mother   . Heart disease Father        CHF  . Alzheimer's disease Father   . Heart disease Maternal Grandfather   . Cancer Maternal Aunt        colon  . Breast cancer Paternal Aunt     Social History   Tobacco Use  . Smoking status: Never Smoker  . Smokeless tobacco: Never Used  Vaping Use  . Vaping Use: Never used  Substance Use Topics  . Alcohol use: Yes    Alcohol/week: 3.0 standard drinks    Types: 3 Glasses of wine per week    Comment: per week  . Drug use: No    Prior to Admission medications   Medication Sig Start Date End Date Taking? Authorizing Provider  Biotin 5000 MCG CAPS  Take 5,000 mcg by mouth 2 (two) times daily.     [provider]  Biotin w/ Vitamins C & E (HAIR/SKIN/NAILS PO) Take 1 tablet by mouth daily.    [provider]  cholecalciferol (VITAMIN D) 1000 UNITS tablet Take 1,000 Units by mouth daily.     [provider]  Cobalamine Combinations (VITAMIN B12-FOLIC ACID PO) Take 1 tablet by mouth daily.    [provider]  Evening Primrose Oil 1000 MG CAPS Take 1,300 mg by mouth 2 (two) times daily.     [provider]  OVER THE COUNTER MEDICATION Take 1 tablet by mouth daily. shaklee multivitamin --strip of seven pills that you take once a day.    [provider]  OVER THE COUNTER MEDICATION Take 1 tablet by mouth daily. Nutriferon  Immune system Supplement    [provider]  OVER THE COUNTER MEDICATION AlgeaCal plus--Stromtrin boost    [provider]    Allergies Hydrocodone and Tramadol   REVIEW OF SYSTEMS  Negative except as noted here or in the History of Present Illness.   PHYSICAL EXAMINATION  Initial Vital Signs Blood pressure 132/86, pulse 100, temperature 100  F (37.8 C), temperature source Oral, resp. rate 18, height 5\' 5"  (1.651 m), weight 54.4 kg, SpO2 97 %.  Examination General: Well-developed, well-nourished female in no acute distress; appearance consistent with age of record HENT: normocephalic; atraumatic; nasal congestion; no pharyngeal erythema or exudate Eyes: pupils equal, round and reactive to light; extraocular muscles intact Neck: supple Heart: regular rate and rhythm Lungs: clear to auscultation bilaterally Abdomen: soft; nondistended; nontender; bowel sounds present Extremities: No deformity; full range of motion; pulses normal Neurologic: Awake, alert and oriented; motor function intact in all extremities and symmetric; no facial droop Skin: Warm and dry Psychiatric: Normal mood and affect   RESULTS  Summary of this visit's results,  reviewed and interpreted by myself:   EKG Interpretation  Date/Time:    Ventricular Rate:    PR Interval:    QRS Duration:   QT Interval:    QTC Calculation:   R Axis:     Text Interpretation:        Laboratory Studies: Results for orders placed or performed during the hospital encounter of 03/15/20 (from the past 24 hour(s))  Respiratory Panel by RT PCR (Flu A&B, Covid) - Nasopharyngeal Swab     Status: None   Collection Time: 03/15/20 12:48 AM   Specimen: Nasopharyngeal Swab  Result Value Ref Range   SARS Coronavirus 2 by RT PCR NEGATIVE NEGATIVE   Influenza A by PCR NEGATIVE NEGATIVE   Influenza B by PCR NEGATIVE NEGATIVE   Imaging Studies: No results found.  ED COURSE and MDM  Nursing notes, initial and subsequent vitals signs, including pulse oximetry, reviewed and interpreted by myself.  Vitals:   03/15/20 0018 03/15/20 0021  BP:  132/86  Pulse:  100  Resp:  18  Temp:  100 F (37.8 C)  TempSrc:  Oral  SpO2:  97%  Weight: 54.4 kg   Height: 5\' 5"  (1.651 m)    Medications  acetaminophen (TYLENOL) tablet 1,000 mg (has no administration in time range)    The patient was advised of negative Covid test but advised that false negatives can occur and she should avoid contact with others until she recovers.  PROCEDURES  Procedures   ED DIAGNOSES     ICD-10-CM   1. URI with cough and congestion  J06.9   2. COVID-19 virus not detected  Y30.160        Shanon Rosser, MD 03/15/20 0222

## 2020-04-22 ENCOUNTER — Ambulatory Visit (INDEPENDENT_AMBULATORY_CARE_PROVIDER_SITE_OTHER): Payer: Medicare Other | Admitting: Obstetrics & Gynecology

## 2020-04-22 ENCOUNTER — Other Ambulatory Visit: Payer: Self-pay

## 2020-04-22 ENCOUNTER — Encounter: Payer: Self-pay | Admitting: Obstetrics & Gynecology

## 2020-04-22 VITALS — BP 128/72 | Ht 64.0 in | Wt 122.0 lb

## 2020-04-22 DIAGNOSIS — M810 Age-related osteoporosis without current pathological fracture: Secondary | ICD-10-CM

## 2020-04-22 DIAGNOSIS — Z01419 Encounter for gynecological examination (general) (routine) without abnormal findings: Secondary | ICD-10-CM

## 2020-04-22 DIAGNOSIS — Z780 Asymptomatic menopausal state: Secondary | ICD-10-CM

## 2020-04-22 NOTE — Progress Notes (Signed)
Kelly Hahn 05/13/1951 [id number]   History:    69 y.o. G1P1L1  Married.  Husband retired Statistician and Prostate Ca  RP:  Established patient presenting for annual gyn exam   HPI: Postmenopause, well on no HRT.  No PMB.  Mild Postmenopausal atrophic changes.  No pelvic pain.  Currently abstinent.  Osteoporosis.  DEXA 2019 T score -3 stable from prior DEXA.  Patient declined medications, her mother suffered jaw necrosis.  She is actively involved in weightbearing exercise.  Repeat BD at [facility] is scheduled.  Breasts normal.  Mammography 07/2019, will obtain from [facility]. Pap smear 2018.  No history of abnormal Pap smears. Colonoscopy 2019. Health labs with Fam MD.  BMI 20.94.  Good fitness and healthy nutrition.  A lot of stress caring for husband.  Past medical history,surgical history, family history and social history were all reviewed and documented in the EPIC chart.  Gynecologic History No LMP recorded. Patient is postmenopausal.  Obstetric History OB History  Gravida Para Term Preterm AB Living  1 1 1      0  SAB TAB Ectopic Multiple Live Births               # Outcome Date GA Lbr Len/2nd Weight Sex Delivery Anes PTL Lv  1 Term              ROS: A ROS was performed and pertinent positives and negatives are included in the history.  GENERAL: No fevers or chills. HEENT: No change in vision, no earache, sore throat or sinus congestion. NECK: No pain or stiffness. CARDIOVASCULAR: No chest pain or pressure. No palpitations. PULMONARY: No shortness of breath, cough or wheeze. GASTROINTESTINAL: No abdominal pain, nausea, vomiting or diarrhea, melena or bright red blood per rectum. GENITOURINARY: No urinary frequency, urgency, hesitancy or dysuria. MUSCULOSKELETAL: No joint or muscle pain, no back pain, no recent trauma. DERMATOLOGIC: No rash, no itching, no lesions. ENDOCRINE: No polyuria, polydipsia, no heat or cold intolerance. No recent change in weight. HEMATOLOGICAL: No  anemia or easy bruising or bleeding. NEUROLOGIC: No headache, seizures, numbness, tingling or weakness. PSYCHIATRIC: No depression, no loss of interest in normal activity or change in sleep pattern.     Exam:   BP 128/72   Ht 5\' 4"  (1.626 m)   Wt 122 lb (55.3 kg)   BMI 20.94 kg/m   Body mass index is 20.94 kg/m.  General appearance : Well developed well nourished female. No acute distress HEENT: Eyes: no retinal hemorrhage or exudates,  Neck supple, trachea midline, no carotid bruits, no thyroidmegaly Lungs: Clear to auscultation, no rhonchi or wheezes, or rib retractions  Heart: Regular rate and rhythm, no murmurs or gallops Breast:Examined in sitting and supine position were symmetrical in appearance, no palpable masses or tenderness,  no skin retraction, no nipple inversion, no nipple discharge, no skin discoloration, no axillary or supraclavicular lymphadenopathy Abdomen: no palpable masses or tenderness, no rebound or guarding Extremities: no edema or skin discoloration or tenderness  Pelvic: Vulva: Normal             Vagina: No gross lesions or discharge  Cervix: No gross lesions or discharge  Uterus  AV, normal size, shape and consistency, non-tender and mobile  Adnexa  Without masses or tenderness  Anus: Normal   Assessment/Plan:  69 y.o. female for annual exam   1. Well female exam with routine gynecological exam Normal gynecologic exam in menopause.  Pap test in 2018 was negative, no history  of abnormal Pap test.  No indication to repeat a Pap test this year.  Breast exam normal.  Screening mammogram done at [facility] in 2021, will obtain report.  Colonoscopy in 2019.  Health labs with family physician.  Good body mass index at 20.94.  Continue with fitness and healthy nutrition.  2. Postmenopause Well on no hormone replacement therapy.  No postmenopausal bleeding.  3. Age-related osteoporosis without current pathological fracture Bone density scheduled at [facility].   Continue with vitamin D supplements, calcium intake of 1500 mg daily including nutritional, regular weightbearing physical activities to continue.  Princess Bruins MD, 2:42 PM 04/22/2020

## 2020-05-06 DIAGNOSIS — M79642 Pain in left hand: Secondary | ICD-10-CM | POA: Diagnosis not present

## 2020-05-06 DIAGNOSIS — D219 Benign neoplasm of connective and other soft tissue, unspecified: Secondary | ICD-10-CM | POA: Diagnosis not present

## 2020-05-23 DIAGNOSIS — Z23 Encounter for immunization: Secondary | ICD-10-CM | POA: Diagnosis not present

## 2020-06-21 DIAGNOSIS — M8589 Other specified disorders of bone density and structure, multiple sites: Secondary | ICD-10-CM | POA: Diagnosis not present

## 2020-06-21 DIAGNOSIS — Z780 Asymptomatic menopausal state: Secondary | ICD-10-CM | POA: Diagnosis not present

## 2020-06-26 ENCOUNTER — Telehealth: Payer: Self-pay | Admitting: *Deleted

## 2020-06-26 NOTE — Telephone Encounter (Addendum)
Call to patient. Patient notified of BMD results from [facility] on 06-21-20. See scanned report in Epic. Patient requests copy be mailed to her house. Address on file confirmed.   Encounter closed.

## 2020-06-27 DIAGNOSIS — H04123 Dry eye syndrome of bilateral lacrimal glands: Secondary | ICD-10-CM | POA: Diagnosis not present

## 2020-06-27 DIAGNOSIS — H2513 Age-related nuclear cataract, bilateral: Secondary | ICD-10-CM | POA: Diagnosis not present

## 2020-07-05 DIAGNOSIS — Z1212 Encounter for screening for malignant neoplasm of rectum: Secondary | ICD-10-CM | POA: Diagnosis not present

## 2020-07-17 ENCOUNTER — Encounter: Payer: Self-pay | Admitting: Anesthesiology

## 2020-10-05 DIAGNOSIS — Z20822 Contact with and (suspected) exposure to covid-19: Secondary | ICD-10-CM | POA: Diagnosis not present

## 2020-10-05 DIAGNOSIS — S82041A Displaced comminuted fracture of right patella, initial encounter for closed fracture: Secondary | ICD-10-CM | POA: Diagnosis not present

## 2020-10-05 DIAGNOSIS — W19XXXA Unspecified fall, initial encounter: Secondary | ICD-10-CM | POA: Diagnosis not present

## 2020-10-06 ENCOUNTER — Inpatient Hospital Stay (HOSPITAL_COMMUNITY)
Admission: EM | Admit: 2020-10-06 | Discharge: 2020-10-10 | DRG: 517 | Disposition: A | Discharge status: Home Health | Payer: Medicare Other | Attending: Orthopedic Surgery | Admitting: Orthopedic Surgery

## 2020-10-06 ENCOUNTER — Emergency Department: Payer: Self-pay

## 2020-10-06 ENCOUNTER — Encounter (HOSPITAL_COMMUNITY): Payer: Self-pay

## 2020-10-06 ENCOUNTER — Other Ambulatory Visit: Payer: Self-pay

## 2020-10-06 DIAGNOSIS — S82031D Displaced transverse fracture of right patella, subsequent encounter for closed fracture with routine healing: Secondary | ICD-10-CM

## 2020-10-06 DIAGNOSIS — E785 Hyperlipidemia, unspecified: Secondary | ICD-10-CM | POA: Diagnosis not present

## 2020-10-06 DIAGNOSIS — S82009A Unspecified fracture of unspecified patella, initial encounter for closed fracture: Secondary | ICD-10-CM | POA: Diagnosis not present

## 2020-10-06 DIAGNOSIS — I959 Hypotension, unspecified: Secondary | ICD-10-CM | POA: Diagnosis not present

## 2020-10-06 DIAGNOSIS — Z79899 Other long term (current) drug therapy: Secondary | ICD-10-CM | POA: Diagnosis not present

## 2020-10-06 DIAGNOSIS — M7989 Other specified soft tissue disorders: Secondary | ICD-10-CM | POA: Diagnosis not present

## 2020-10-06 DIAGNOSIS — Z20822 Contact with and (suspected) exposure to covid-19: Secondary | ICD-10-CM | POA: Diagnosis present

## 2020-10-06 DIAGNOSIS — W010XXA Fall on same level from slipping, tripping and stumbling without subsequent striking against object, initial encounter: Secondary | ICD-10-CM | POA: Diagnosis present

## 2020-10-06 DIAGNOSIS — E7800 Pure hypercholesterolemia, unspecified: Secondary | ICD-10-CM | POA: Diagnosis present

## 2020-10-06 DIAGNOSIS — M810 Age-related osteoporosis without current pathological fracture: Secondary | ICD-10-CM | POA: Diagnosis present

## 2020-10-06 DIAGNOSIS — S82001A Unspecified fracture of right patella, initial encounter for closed fracture: Secondary | ICD-10-CM | POA: Diagnosis not present

## 2020-10-06 DIAGNOSIS — S82031A Displaced transverse fracture of right patella, initial encounter for closed fracture: Principal | ICD-10-CM | POA: Diagnosis present

## 2020-10-06 DIAGNOSIS — S82041A Displaced comminuted fracture of right patella, initial encounter for closed fracture: Secondary | ICD-10-CM | POA: Diagnosis not present

## 2020-10-06 DIAGNOSIS — G8918 Other acute postprocedural pain: Secondary | ICD-10-CM | POA: Diagnosis not present

## 2020-10-06 DIAGNOSIS — Y92214 College as the place of occurrence of the external cause: Secondary | ICD-10-CM | POA: Diagnosis not present

## 2020-10-06 DIAGNOSIS — Z419 Encounter for procedure for purposes other than remedying health state, unspecified: Secondary | ICD-10-CM

## 2020-10-06 DIAGNOSIS — M25561 Pain in right knee: Secondary | ICD-10-CM

## 2020-10-06 LAB — CBC
HCT: 39.6 % (ref 36.0–46.0)
Hemoglobin: 12.4 g/dL (ref 12.0–15.0)
MCH: 26.7 pg (ref 26.0–34.0)
MCHC: 31.3 g/dL (ref 30.0–36.0)
MCV: 85.3 fL (ref 80.0–100.0)
Platelets: 296 10*3/uL (ref 150–400)
RBC: 4.64 MIL/uL (ref 3.87–5.11)
RDW: 14.4 % (ref 11.5–15.5)
WBC: 9.8 10*3/uL (ref 4.0–10.5)
nRBC: 0 % (ref 0.0–0.2)

## 2020-10-06 LAB — RESP PANEL BY RT-PCR (FLU A&B, COVID) ARPGX2
Influenza A by PCR: NEGATIVE
Influenza B by PCR: NEGATIVE
SARS Coronavirus 2 by RT PCR: NEGATIVE

## 2020-10-06 LAB — CREATININE, SERUM
Creatinine, Ser: 0.69 mg/dL (ref 0.44–1.00)
GFR, Estimated: >60 mL/min (ref >60)

## 2020-10-06 MED ORDER — DOCUSATE SODIUM 100 MG PO CAPS
100.0000 mg | ORAL_CAPSULE | Freq: Two times a day (BID) | ORAL | Status: DC
  Administered 2020-10-06 – 2020-10-07 (×3): 100 mg via ORAL
  Filled 2020-10-06 (×3): qty 1

## 2020-10-06 MED ORDER — ENOXAPARIN SODIUM 40 MG/0.4ML IJ SOSY
40.0000 mg | PREFILLED_SYRINGE | INTRAMUSCULAR | Status: DC
  Administered 2020-10-06: 40 mg via SUBCUTANEOUS
  Filled 2020-10-06: qty 0.4

## 2020-10-06 MED ORDER — SODIUM CHLORIDE 0.9 % IV SOLN: INTRAVENOUS | Status: DC

## 2020-10-06 MED ORDER — HYDROCODONE-ACETAMINOPHEN 5-325 MG PO TABS
2.0000 | ORAL_TABLET | Freq: Once | ORAL | Status: AC
  Administered 2020-10-06: 2 via ORAL
  Filled 2020-10-06: qty 2

## 2020-10-06 MED ORDER — HYDROCODONE-ACETAMINOPHEN 5-325 MG PO TABS
1.0000 | ORAL_TABLET | ORAL | Status: DC | PRN
  Administered 2020-10-06 – 2020-10-08 (×7): 2 via ORAL
  Filled 2020-10-06 (×7): qty 2

## 2020-10-06 MED ORDER — OXYCODONE-ACETAMINOPHEN 5-325 MG PO TABS
1.0000 | ORAL_TABLET | Freq: Once | ORAL | Status: AC
  Administered 2020-10-06: 1 via ORAL
  Filled 2020-10-06: qty 1

## 2020-10-06 MED ORDER — METHOCARBAMOL 500 MG PO TABS
500.0000 mg | ORAL_TABLET | Freq: Four times a day (QID) | ORAL | Status: DC | PRN
  Administered 2020-10-06 – 2020-10-07 (×2): 500 mg via ORAL
  Filled 2020-10-06 (×2): qty 1

## 2020-10-06 MED ORDER — MORPHINE SULFATE (PF) 2 MG/ML IV SOLN
0.5000 mg | INTRAVENOUS | Status: DC | PRN
  Administered 2020-10-08: 1 mg via INTRAVENOUS
  Filled 2020-10-06: qty 1

## 2020-10-06 MED ORDER — METHOCARBAMOL 1000 MG/10ML IJ SOLN
500.0000 mg | Freq: Four times a day (QID) | INTRAVENOUS | Status: DC | PRN
  Filled 2020-10-06: qty 5

## 2020-10-06 MED ORDER — ACETAMINOPHEN 325 MG PO TABS
325.0000 mg | ORAL_TABLET | Freq: Four times a day (QID) | ORAL | Status: DC | PRN
Start: 2020-10-07 — End: 2020-10-08

## 2020-10-06 NOTE — ED Notes (Signed)
Happy meal provided.  Plan for transfer discussed with the understanding more info will be provided as it is available.

## 2020-10-06 NOTE — ED Notes (Signed)
Radiology to put outpatient imaging disc into PACS at this time.

## 2020-10-06 NOTE — ED Triage Notes (Signed)
Patient arrived by POV after having a trip and fall yesterday at college graduation. Patient seen in ED out of town and has fractured patella, arrived with disc of images and in knee immobilizer. Patient hee for pain control and ortho

## 2020-10-06 NOTE — H&P (Signed)
Kelly Hahn is an 70 y.o. female.    Chief Complaint: right closed patella fracture   HPI: Pt is a 70 y.o. female complaining of right knee pain after fall. She is here for evaluation of severe right knee pain after a fall yesterday.  She was evaluated and diagnosed with a patella fracture, treated with hydrocodone and a knee immobilizer.  Injury occurred while she was visiting [place], [place].    She denies nausea, vomiting, weakness or dizziness.  There are no other known modifying factors  History of left patella fracture status post ORIF  PCP:  Leanna Battles, MD  D/C Plans: To be determined following appropriate treatment plan  PMH: Past Medical History:  Diagnosis Date  . Anemia   . Endometriosis   . Fall   . Fracture of arm   . Heart murmur   . High cholesterol   . Osteoporosis 07/2017   T score -3.0  . Palpitations   . Schatzki's ring   . Thyroid disease    Nodules    PSH: Past Surgical History:  Procedure Laterality Date  . CESAREAN SECTION    . ORIF PATELLA Left 03/03/2016   Procedure: OPEN REDUCTION INTERNAL (ORIF) FIXATION PATELLA;  Surgeon: Paralee Cancel, MD;  Location: [facility];  Service: Orthopedics;  Laterality: Left;  . PELVIC LAPAROSCOPY  1989  . tummy tuck      Social History:  reports that she has never smoked. She has never used smokeless tobacco. She reports current alcohol use of about 3.0 standard drinks of alcohol per week. She reports that she does not use drugs.  Allergies:  Allergies  Allergen Reactions  . Hydrocodone Other (See Comments)    Makes her feel like she's not breathing  . Tramadol Other (See Comments)    "head pounding, made me loopy, delusional, too strong for me"     Medications: Medications Prior to Admission  Medication Sig Dispense Refill  . B Complex Vitamins (B COMPLEX PO) Take 1 tablet by mouth daily.    . Biotin 5000 MCG CAPS Take 5,000 mcg by mouth 2 (two) times daily.     . cholecalciferol  (VITAMIN D) 1000 UNITS tablet Take 1,000 Units by mouth daily.     . Cobalamine Combinations (VITAMIN B12-FOLIC ACID PO) Take 1 tablet by mouth daily.    . Evening Primrose Oil 1000 MG CAPS Take 1,300 mg by mouth 2 (two) times daily.     Marland Kitchen OVER THE COUNTER MEDICATION Take 1 tablet by mouth daily. shaklee multivitamin --strip of seven pills that you take once a day.    Marland Kitchen OVER THE COUNTER MEDICATION Take 1 tablet by mouth daily. Nutriferon  Immune system Supplement    . OVER THE COUNTER MEDICATION AlgeaCal plus--Stromtrin boost      Results for orders placed or performed during the hospital encounter of 10/06/20 (from the past 48 hour(s))  Resp Panel by RT-PCR (Flu A&B, Covid) Nasopharyngeal Swab     Status: None   Collection Time: 10/06/20  3:38 PM   Specimen: Nasopharyngeal Swab; Nasopharyngeal(NP) swabs in vial transport medium  Result Value Ref Range   SARS Coronavirus 2 by RT PCR NEGATIVE NEGATIVE    Comment: (NOTE) SARS-CoV-2 target nucleic acids are NOT DETECTED.  The SARS-CoV-2 RNA is generally detectable in upper respiratory specimens during the acute phase of infection. The lowest concentration of SARS-CoV-2 viral copies this assay can detect is 138 copies/mL. A negative result does not preclude SARS-Cov-2 infection and  should not be used as the sole basis for treatment or other patient management decisions. A negative result may occur with  improper specimen collection/handling, submission of specimen other than nasopharyngeal swab, presence of viral mutation(s) within the areas targeted by this assay, and inadequate number of viral copies(<138 copies/mL). A negative result must be combined with clinical observations, patient history, and epidemiological information. The expected result is Negative.  Fact Sheet for Patients:  EntrepreneurPulse.com.au  Fact Sheet for Healthcare Providers:  IncredibleEmployment.be  This test is no t yet  approved or cleared by the [place] FDA and  has been authorized for detection and/or diagnosis of SARS-CoV-2 by FDA under an Emergency Use Authorization (EUA). This EUA will remain  in effect (meaning this test can be used) for the duration of the COVID-19 declaration under Section 564(b)(1) of the Act, 21 U.S.C.section 360bbb-3(b)(1), unless the authorization is terminated  or revoked sooner.       Influenza A by PCR NEGATIVE NEGATIVE   Influenza B by PCR NEGATIVE NEGATIVE    Comment: (NOTE) The Xpert Xpress SARS-CoV-2/FLU/RSV plus assay is intended as an aid in the diagnosis of influenza from Nasopharyngeal swab specimens and should not be used as a sole basis for treatment. Nasal washings and aspirates are unacceptable for Xpert Xpress SARS-CoV-2/FLU/RSV testing.  Fact Sheet for Patients: EntrepreneurPulse.com.au  Fact Sheet for Healthcare Providers: IncredibleEmployment.be  This test is not yet approved or cleared by the [place] FDA and has been authorized for detection and/or diagnosis of SARS-CoV-2 by FDA under an Emergency Use Authorization (EUA). This EUA will remain in effect (meaning this test can be used) for the duration of the COVID-19 declaration under Section 564(b)(1) of the Act, 21 U.S.C. section 360bbb-3(b)(1), unless the authorization is terminated or revoked.  Performed at [facility], [place] [address], [place], [place] [address]    DG Outside Films Extremity  Result Date: 10/06/2020 This examination belongs to an outside facility and is stored here for comparison purposes only.  Contact the originating outside institution for any associated report or interpretation.   ROS: Review of Systems - Negative except for that related to her admission  Physican Exam: Blood pressure 121/89, pulse 60, temperature 98.7 F (37.1 C), temperature source Oral, resp. rate 16, SpO2 100 %. Constitutional:       General: She is not in acute distress.    Appearance: She is well-developed. She is not ill-appearing, toxic-appearing or diaphoretic.  HENT:     Head: Normocephalic and atraumatic.     Right Ear: External ear normal.     Left Ear: External ear normal.  Eyes:     Conjunctiva/sclera: Conjunctivae normal.     Pupils: Pupils are equal, round, and reactive to light.  Neck:     Trachea: Phonation normal.  Cardiovascular:     Rate and Rhythm: Normal rate.  Pulmonary:     Effort: Pulmonary effort is normal.  Abdominal:     General: There is no distension.  Musculoskeletal:     Cervical back: Normal range of motion and neck supple.     Comments: She is wearing immobilizer on her right knee.  Immobilizer left in place for initial exam.  Neurovascular intact distally in the right foot.  Skin:    General: Skin is warm and dry.  Neurological:     Mental Status: She is alert and oriented to person, place, and time.     Cranial Nerves: No cranial nerve deficit.  Sensory: No sensory deficit.     Motor: No abnormal muscle tone.     Coordination: Coordination normal.  Psychiatric:        Mood and Affect: Mood normal.        Behavior: Behavior normal.        Thought Content: Thought content normal.        Judgment: Judgment normal.   Assessment/Plan Assessment: 1. Closed displaced right patella fracture   Plan: 1. Admit for pain control and planned procedure to fix patella 2. Regular diet 3. Surgery likely Tuesday   Pietro Cassis. Alvan Dame, MD  10/06/2020, 10:04 PM

## 2020-10-06 NOTE — ED Notes (Signed)
This RN went in to assist pt with bed. While helping reposition the pt, pt asked if she could take her own pain medicine. Pt stated "I have pain medicine in my purse can I just take that." This RN suggested pt not take her own pain medicine but that I would reach out to the doctor to notify them of her pain. Dr. Eulis Foster notified. Will continue to monitor.

## 2020-10-06 NOTE — Plan of Care (Signed)
POC initiated 

## 2020-10-06 NOTE — ED Provider Notes (Signed)
[facility] EMERGENCY DEPARTMENT Provider Note   CSN: [id number] Arrival date & time: 10/06/20  1216     History No chief complaint on file.   Kelly Hahn is a 70 y.o. female.  HPI She is here for evaluation of severe right knee pain after a fall yesterday.  She was evaluated and diagnosed with a patella fracture, treated with hydrocodone and a knee immobilizer.  Injury occurred while she was visiting [place], [place].  She is here this morning to get pain control and contact with her orthopedist, Dr. Alvan Dame.  She denies nausea, vomiting, weakness or dizziness.  There are no other known modifying factors.    Past Medical History:  Diagnosis Date  . Anemia   . Endometriosis   . Fall   . Fracture of arm   . Heart murmur   . High cholesterol   . Osteoporosis 07/2017   T score -3.0  . Palpitations   . Schatzki's ring   . Thyroid disease    Nodules    Patient Active Problem List   Diagnosis Date Noted  . Patella fracture 10/06/2020  . Left patella fracture 03/03/2016  . Alopecia 05/03/2012  . Heartburn 02/26/2012  . Abnormal liver function test 12/08/2011  . PVC (premature ventricular contraction) 12/08/2011  . Goiter 12/08/2011  . Grief at loss of child 12/08/2011  . Hyperlipidemia 12/08/2011  . Snoring 09/17/2011  . Osteoporosis   . Endometriosis     Past Surgical History:  Procedure Laterality Date  . CESAREAN SECTION    . ORIF PATELLA Left 03/03/2016   Procedure: OPEN REDUCTION INTERNAL (ORIF) FIXATION PATELLA;  Surgeon: Paralee Cancel, MD;  Location: [facility];  Service: Orthopedics;  Laterality: Left;  . PELVIC LAPAROSCOPY  1989  . tummy tuck       OB History    Gravida  1   Para  1   Term  1   Preterm      AB      Living  0     SAB      IAB      Ectopic      Multiple      Live Births              Family History  Problem Relation Age of Onset  . Hypertension Mother   . Diabetes Mother   .  Osteoarthritis Mother   . Heart disease Mother   . Heart disease Father        CHF  . Alzheimer's disease Father   . Heart disease Maternal Grandfather   . Cancer Maternal Aunt        colon  . Breast cancer Paternal Aunt     Social History   Tobacco Use  . Smoking status: Never Smoker  . Smokeless tobacco: Never Used  Vaping Use  . Vaping Use: Never used  Substance Use Topics  . Alcohol use: Yes    Alcohol/week: 3.0 standard drinks    Types: 3 Glasses of wine per week    Comment: per week  . Drug use: No    Home Medications Prior to Admission medications   Medication Sig Start Date End Date Taking? Authorizing Provider  Biotin 5000 MCG CAPS Take 5,000 mcg by mouth 2 (two) times daily.     [provider]  Biotin w/ Vitamins C & E (HAIR/SKIN/NAILS PO) Take 1 tablet by mouth daily.    [provider]  cholecalciferol (VITAMIN D)  1000 UNITS tablet Take 1,000 Units by mouth daily.     [provider]  Cobalamine Combinations (VITAMIN B12-FOLIC ACID PO) Take 1 tablet by mouth daily.    [provider]  Evening Primrose Oil 1000 MG CAPS Take 1,300 mg by mouth 2 (two) times daily.     [provider]  OVER THE COUNTER MEDICATION Take 1 tablet by mouth daily. shaklee multivitamin --strip of seven pills that you take once a day.    [provider]  OVER THE COUNTER MEDICATION Take 1 tablet by mouth daily. Nutriferon  Immune system Supplement    [provider]  OVER THE COUNTER MEDICATION AlgeaCal plus--Stromtrin boost    [provider]    Allergies    Hydrocodone and Tramadol  Review of Systems   Review of Systems  All other systems reviewed and are negative.   Physical Exam Updated Vital Signs BP 107/68 (BP Location: Left Arm)   Pulse 67   Temp 98.7 F (37.1 C) (Oral)   Resp 18   SpO2 99%   Physical Exam Vitals and nursing note reviewed.  Constitutional:      General: She is not in acute  distress.    Appearance: She is well-developed. She is not ill-appearing, toxic-appearing or diaphoretic.  HENT:     Head: Normocephalic and atraumatic.     Right Ear: External ear normal.     Left Ear: External ear normal.  Eyes:     Conjunctiva/sclera: Conjunctivae normal.     Pupils: Pupils are equal, round, and reactive to light.  Neck:     Trachea: Phonation normal.  Cardiovascular:     Rate and Rhythm: Normal rate.  Pulmonary:     Effort: Pulmonary effort is normal.  Abdominal:     General: There is no distension.  Musculoskeletal:     Cervical back: Normal range of motion and neck supple.     Comments: She is wearing immobilizer on her right knee.  Immobilizer left in place for initial exam.  Neurovascular intact distally in the right foot.  Skin:    General: Skin is warm and dry.  Neurological:     Mental Status: She is alert and oriented to person, place, and time.     Cranial Nerves: No cranial nerve deficit.     Sensory: No sensory deficit.     Motor: No abnormal muscle tone.     Coordination: Coordination normal.  Psychiatric:        Mood and Affect: Mood normal.        Behavior: Behavior normal.        Thought Content: Thought content normal.        Judgment: Judgment normal.     ED Results / Procedures / Treatments   Labs (all labs ordered are listed, but only abnormal results are displayed) Labs Reviewed  RESP PANEL BY RT-PCR (FLU A&B, COVID) [id number]    EKG None  Radiology DG Outside Films Extremity  Result Date: 10/06/2020 This examination belongs to an outside facility and is stored here for comparison purposes only.  Contact the originating outside institution for any associated report or interpretation.   Procedures Procedures   Medications Ordered in ED Medications  HYDROcodone-acetaminophen (NORCO/VICODIN) 5-325 MG per tablet 2 tablet (2 tablets Oral Given 10/06/20 1530)    ED Course  I have reviewed the triage vital signs and the nursing  notes.  Pertinent labs & imaging results that were available during my care of  the patient were reviewed by me and considered in my medical decision making (see chart for details).  Clinical Course as of 10/06/20 1533  Sun Oct 06, 2020  1451 DG Outside Films Extremity [EW]  5573 DG Outside Films Extremity [EW]    Clinical Course User Index [EW] Daleen Bo, MD   MDM Rules/Calculators/A&P                           Patient Vitals for the past 24 hrs:  BP Temp Temp src Pulse Resp SpO2  10/06/20 1233 107/68 98.7 F (37.1 C) Oral 67 18 99 %    3:27 PM Reevaluation with update and discussion. After initial assessment and treatment, an updated evaluation reveals an individual waiting with her, are anxious and ready for disposition and treatment plan.  I explained to them that I was sorry for the weight, and that it took a while to get the films uploaded.  They are currently not available for assessment.  I have visualized the films.  I will also talk to Dr. Alvan Dame, who accepts patient for admission at [facility].  He plans on doing surgery in 2 days.  Patient is agreeable to this. Daleen Bo   Medical Decision Making:  This patient is presenting for evaluation of right patella fracture, which does require a range of treatment options, and is a complaint that involves a moderate risk of morbidity and mortality. The differential diagnoses include fracture, contusion, associated injuries. I decided to review old records, and in summary Ehly female presenting with isolated injury to right knee.  She has a patella fracture, unstable, requiring surgical repair.  She is unable to manage herself at home, with this injury.  I obtained additional historical information from friend at bedside.  Clinical Laboratory Tests Ordered, included Viral panel. Review indicates pending at time of disposition. Radiologic Tests Ordered, included right knee x-ray obtained yesterday in [place], [place], has been downloaded and available for review.  I independently Visualized: Radiograph images, which show mid patella fracture, slightly displaced.  Knee effusion present.  No other associated injury.    Critical Interventions-clinical evaluation, medication treatment, discussion with orthopedist to arrange for admission.  After These Interventions, the Patient was reevaluated and was found with injury requiring surgical revision.  Patient will be transferred to [facility] for stabilization until she can have surgery.  CRITICAL CARE-no  Performed by: Daleen Bo  Nursing Notes Reviewed/ Care Coordinated Applicable Imaging Reviewed Interpretation of Laboratory Data incorporated into ED treatment  Plan: Admit to orthopedics, Dr. Ihor Gully.   Final Clinical Impression(s) / ED Diagnoses Final diagnoses:  Closed displaced transverse fracture of right patella with routine healing, subsequent encounter    Rx / DC Orders ED Discharge Orders    None       Daleen Bo, MD 10/06/20 1533

## 2020-10-07 ENCOUNTER — Encounter (HOSPITAL_COMMUNITY): Payer: Self-pay | Admitting: Orthopedic Surgery

## 2020-10-07 MED ORDER — ENSURE PRE-SURGERY PO LIQD
296.0000 mL | Freq: Once | ORAL | Status: AC
Start: 2020-10-07 — End: 2020-10-07
  Administered 2020-10-07: 296 mL via ORAL
  Filled 2020-10-07: qty 296

## 2020-10-07 NOTE — Progress Notes (Signed)
Patient ID: Kelly Hahn, female   DOB: Nov 16, 1950, 70 y.o.   MRN: [id number]  Some pain Appreciative to be admitted given conditions at home particularly related to her husbands health  Plan: NPO after midnight for OR tomorrow Orders to follow

## 2020-10-08 ENCOUNTER — Inpatient Hospital Stay (HOSPITAL_COMMUNITY): Payer: Medicare Other | Admitting: Certified Registered Nurse Anesthetist

## 2020-10-08 ENCOUNTER — Encounter (HOSPITAL_COMMUNITY)
Admission: EM | Disposition: A | Discharge status: Home Health | Payer: Self-pay | Source: Home / Self Care | Attending: Orthopedic Surgery

## 2020-10-08 ENCOUNTER — Encounter (HOSPITAL_COMMUNITY): Payer: Self-pay | Admitting: Orthopedic Surgery

## 2020-10-08 ENCOUNTER — Inpatient Hospital Stay (HOSPITAL_COMMUNITY): Payer: Medicare Other

## 2020-10-08 HISTORY — PX: ORIF PATELLA: SHX5033

## 2020-10-08 LAB — SURGICAL PCR SCREEN
MRSA, PCR: NEGATIVE
Staphylococcus aureus: POSITIVE — AB

## 2020-10-08 SURGERY — OPEN REDUCTION INTERNAL FIXATION (ORIF) PATELLA
Anesthesia: General | Site: Knee | Laterality: Right

## 2020-10-08 MED ORDER — PHENOL 1.4 % MT LIQD
1.0000 | OROMUCOSAL | Status: DC | PRN
Start: 2020-10-08 — End: 2020-10-10

## 2020-10-08 MED ORDER — ASPIRIN 81 MG PO CHEW
81.0000 mg | CHEWABLE_TABLET | Freq: Two times a day (BID) | ORAL | Status: DC
  Administered 2020-10-08 – 2020-10-10 (×4): 81 mg via ORAL
  Filled 2020-10-08 (×4): qty 1

## 2020-10-08 MED ORDER — CLONIDINE HCL (ANALGESIA) 100 MCG/ML EP SOLN
EPIDURAL | Status: DC | PRN
  Administered 2020-10-08: 100 ug

## 2020-10-08 MED ORDER — LACTATED RINGERS IV SOLN: INTRAVENOUS | Status: DC | PRN

## 2020-10-08 MED ORDER — MUPIROCIN 2 % EX OINT: 1.0000 "application " | TOPICAL_OINTMENT | Freq: Two times a day (BID) | CUTANEOUS | Status: DC

## 2020-10-08 MED ORDER — DEXAMETHASONE SODIUM PHOSPHATE 10 MG/ML IJ SOLN
INTRAMUSCULAR | Status: AC
  Filled 2020-10-08: qty 1

## 2020-10-08 MED ORDER — 0.9 % SODIUM CHLORIDE (POUR BTL) OPTIME
TOPICAL | Status: DC | PRN
  Administered 2020-10-08: 1000 mL

## 2020-10-08 MED ORDER — SODIUM CHLORIDE (PF) 0.9 % IJ SOLN
INTRAMUSCULAR | Status: DC | PRN
  Administered 2020-10-08: 30 mL

## 2020-10-08 MED ORDER — ONDANSETRON HCL 4 MG/2ML IJ SOLN: 4.0000 mg | Freq: Once | INTRAMUSCULAR | Status: DC | PRN

## 2020-10-08 MED ORDER — CELECOXIB 200 MG PO CAPS
200.0000 mg | ORAL_CAPSULE | Freq: Two times a day (BID) | ORAL | Status: DC
  Administered 2020-10-08 – 2020-10-09 (×3): 200 mg via ORAL
  Filled 2020-10-08 (×3): qty 1

## 2020-10-08 MED ORDER — EPHEDRINE SULFATE-NACL 50-0.9 MG/10ML-% IV SOSY
PREFILLED_SYRINGE | INTRAVENOUS | Status: DC | PRN
  Administered 2020-10-08: 10 mg via INTRAVENOUS

## 2020-10-08 MED ORDER — DEXAMETHASONE SODIUM PHOSPHATE 10 MG/ML IJ SOLN
INTRAMUSCULAR | Status: DC | PRN
  Administered 2020-10-08: 10 mg

## 2020-10-08 MED ORDER — MEPERIDINE HCL 50 MG/ML IJ SOLN: 6.2500 mg | INTRAMUSCULAR | Status: DC | PRN

## 2020-10-08 MED ORDER — ACETAMINOPHEN 10 MG/ML IV SOLN: 1000.0000 mg | Freq: Once | INTRAVENOUS | Status: DC | PRN

## 2020-10-08 MED ORDER — ONDANSETRON HCL 4 MG/2ML IJ SOLN
4.0000 mg | Freq: Four times a day (QID) | INTRAMUSCULAR | Status: DC | PRN
  Administered 2020-10-08 (×2): 4 mg via INTRAVENOUS
  Filled 2020-10-08: qty 2

## 2020-10-08 MED ORDER — SODIUM CHLORIDE (PF) 0.9 % IJ SOLN
INTRAMUSCULAR | Status: AC
  Filled 2020-10-08: qty 30

## 2020-10-08 MED ORDER — CEFAZOLIN SODIUM-DEXTROSE 2-4 GM/100ML-% IV SOLN
2.0000 g | INTRAVENOUS | Status: AC
  Administered 2020-10-08: 2 g via INTRAVENOUS
  Filled 2020-10-08: qty 100

## 2020-10-08 MED ORDER — MENTHOL 3 MG MT LOZG: 1.0000 | LOZENGE | OROMUCOSAL | Status: DC | PRN

## 2020-10-08 MED ORDER — BUPIVACAINE-EPINEPHRINE (PF) 0.5% -1:200000 IJ SOLN
INTRAMUSCULAR | Status: DC | PRN
  Administered 2020-10-08 (×6): 5 mL via PERINEURAL

## 2020-10-08 MED ORDER — DEXAMETHASONE SODIUM PHOSPHATE 10 MG/ML IJ SOLN
INTRAMUSCULAR | Status: DC | PRN
  Administered 2020-10-08: 5 mg via INTRAVENOUS

## 2020-10-08 MED ORDER — SODIUM CHLORIDE 0.9 % IV SOLN: INTRAVENOUS | Status: DC

## 2020-10-08 MED ORDER — PROPOFOL 10 MG/ML IV BOLUS
INTRAVENOUS | Status: AC
  Filled 2020-10-08: qty 20

## 2020-10-08 MED ORDER — PHENYLEPHRINE HCL-NACL 10-0.9 MG/250ML-% IV SOLN
INTRAVENOUS | Status: DC | PRN
  Administered 2020-10-08: 80 ug/min via INTRAVENOUS

## 2020-10-08 MED ORDER — BUPIVACAINE-EPINEPHRINE (PF) 0.25% -1:200000 IJ SOLN
INTRAMUSCULAR | Status: AC
  Filled 2020-10-08: qty 30

## 2020-10-08 MED ORDER — TRANEXAMIC ACID-NACL 1000-0.7 MG/100ML-% IV SOLN
1000.0000 mg | Freq: Once | INTRAVENOUS | Status: AC
  Administered 2020-10-08: 1000 mg via INTRAVENOUS
  Filled 2020-10-08: qty 100

## 2020-10-08 MED ORDER — CHLORHEXIDINE GLUCONATE CLOTH 2 % EX PADS: 6.0000 | MEDICATED_PAD | Freq: Every day | CUTANEOUS | Status: DC

## 2020-10-08 MED ORDER — FENTANYL CITRATE (PF) 100 MCG/2ML IJ SOLN
50.0000 ug | INTRAMUSCULAR | Status: DC
  Administered 2020-10-08 (×2): 50 ug via INTRAVENOUS
  Filled 2020-10-08: qty 2

## 2020-10-08 MED ORDER — DOCUSATE SODIUM 100 MG PO CAPS
100.0000 mg | ORAL_CAPSULE | Freq: Two times a day (BID) | ORAL | Status: DC
  Administered 2020-10-08 – 2020-10-10 (×4): 100 mg via ORAL
  Filled 2020-10-08 (×4): qty 1

## 2020-10-08 MED ORDER — CHLORHEXIDINE GLUCONATE 4 % EX LIQD: 60.0000 mL | Freq: Once | CUTANEOUS | Status: DC

## 2020-10-08 MED ORDER — ACETAMINOPHEN 325 MG PO TABS
325.0000 mg | ORAL_TABLET | Freq: Four times a day (QID) | ORAL | Status: DC | PRN
Start: 2020-10-09 — End: 2020-10-10
  Administered 2020-10-10: 325 mg via ORAL
  Filled 2020-10-08: qty 2

## 2020-10-08 MED ORDER — MORPHINE SULFATE (PF) 2 MG/ML IV SOLN: 0.5000 mg | INTRAVENOUS | Status: DC | PRN

## 2020-10-08 MED ORDER — ONDANSETRON HCL 4 MG/2ML IJ SOLN
INTRAMUSCULAR | Status: AC
  Filled 2020-10-08: qty 2

## 2020-10-08 MED ORDER — METOCLOPRAMIDE HCL 5 MG PO TABS: 5.0000 mg | ORAL_TABLET | Freq: Three times a day (TID) | ORAL | Status: DC | PRN

## 2020-10-08 MED ORDER — ASPIRIN 81 MG PO CHEW: 81.0000 mg | CHEWABLE_TABLET | Freq: Two times a day (BID) | ORAL | Status: DC

## 2020-10-08 MED ORDER — FENTANYL CITRATE (PF) 100 MCG/2ML IJ SOLN
INTRAMUSCULAR | Status: DC | PRN
  Administered 2020-10-08 (×2): 25 ug via INTRAVENOUS

## 2020-10-08 MED ORDER — MIDAZOLAM HCL 2 MG/2ML IJ SOLN
1.0000 mg | INTRAMUSCULAR | Status: DC
  Administered 2020-10-08 (×2): 1 mg via INTRAVENOUS
  Filled 2020-10-08: qty 2

## 2020-10-08 MED ORDER — KETOROLAC TROMETHAMINE 30 MG/ML IJ SOLN
INTRAMUSCULAR | Status: DC | PRN
  Administered 2020-10-08: 30 mg

## 2020-10-08 MED ORDER — OXYCODONE HCL 5 MG PO TABS
5.0000 mg | ORAL_TABLET | ORAL | Status: DC | PRN
  Administered 2020-10-10 (×2): 10 mg via ORAL
  Filled 2020-10-08 (×2): qty 2

## 2020-10-08 MED ORDER — FENTANYL CITRATE (PF) 100 MCG/2ML IJ SOLN
INTRAMUSCULAR | Status: AC
  Filled 2020-10-08: qty 2

## 2020-10-08 MED ORDER — BISACODYL 10 MG RE SUPP
10.0000 mg | Freq: Every day | RECTAL | Status: DC | PRN
  Administered 2020-10-09: 10 mg via RECTAL
  Filled 2020-10-08: qty 1

## 2020-10-08 MED ORDER — METHOCARBAMOL 1000 MG/10ML IJ SOLN
500.0000 mg | Freq: Four times a day (QID) | INTRAMUSCULAR | Status: DC | PRN
  Filled 2020-10-08: qty 5

## 2020-10-08 MED ORDER — PROPOFOL 500 MG/50ML IV EMUL
INTRAVENOUS | Status: DC | PRN
  Administered 2020-10-08: 230 ug/kg/min via INTRAVENOUS

## 2020-10-08 MED ORDER — FENTANYL CITRATE (PF) 100 MCG/2ML IJ SOLN: 25.0000 ug | INTRAMUSCULAR | Status: DC | PRN

## 2020-10-08 MED ORDER — ONDANSETRON HCL 4 MG PO TABS: 4.0000 mg | ORAL_TABLET | Freq: Four times a day (QID) | ORAL | Status: DC | PRN

## 2020-10-08 MED ORDER — KETOROLAC TROMETHAMINE 30 MG/ML IJ SOLN
INTRAMUSCULAR | Status: AC
  Filled 2020-10-08: qty 1

## 2020-10-08 MED ORDER — POVIDONE-IODINE 10 % EX SWAB: 2.0000 "application " | Freq: Once | CUTANEOUS | Status: DC

## 2020-10-08 MED ORDER — LIDOCAINE 2% (20 MG/ML) 5 ML SYRINGE
INTRAMUSCULAR | Status: DC | PRN
  Administered 2020-10-08: 100 mg via INTRAVENOUS

## 2020-10-08 MED ORDER — ONDANSETRON HCL 4 MG/2ML IJ SOLN: 4.0000 mg | Freq: Four times a day (QID) | INTRAMUSCULAR | Status: DC | PRN

## 2020-10-08 MED ORDER — DIPHENHYDRAMINE HCL 12.5 MG/5ML PO ELIX: 12.5000 mg | ORAL_SOLUTION | ORAL | Status: DC | PRN

## 2020-10-08 MED ORDER — POLYETHYLENE GLYCOL 3350 17 G PO PACK
17.0000 g | PACK | Freq: Every day | ORAL | Status: DC | PRN
  Administered 2020-10-09: 17 g via ORAL
  Filled 2020-10-08: qty 1

## 2020-10-08 MED ORDER — METOCLOPRAMIDE HCL 5 MG/ML IJ SOLN
5.0000 mg | Freq: Three times a day (TID) | INTRAMUSCULAR | Status: DC | PRN
Start: 2020-10-08 — End: 2020-10-10

## 2020-10-08 MED ORDER — CEFAZOLIN SODIUM-DEXTROSE 2-4 GM/100ML-% IV SOLN
2.0000 g | Freq: Four times a day (QID) | INTRAVENOUS | Status: AC
  Administered 2020-10-08 – 2020-10-09 (×2): 2 g via INTRAVENOUS
  Filled 2020-10-08 (×2): qty 100

## 2020-10-08 MED ORDER — BUPIVACAINE-EPINEPHRINE (PF) 0.25% -1:200000 IJ SOLN
INTRAMUSCULAR | Status: DC | PRN
  Administered 2020-10-08 (×2): 30 mL

## 2020-10-08 MED ORDER — PROPOFOL 10 MG/ML IV BOLUS
INTRAVENOUS | Status: DC | PRN
  Administered 2020-10-08: 160 mg via INTRAVENOUS

## 2020-10-08 MED ORDER — LIDOCAINE 2% (20 MG/ML) 5 ML SYRINGE
INTRAMUSCULAR | Status: AC
  Filled 2020-10-08: qty 10

## 2020-10-08 MED ORDER — METHOCARBAMOL 500 MG PO TABS: 500.0000 mg | ORAL_TABLET | Freq: Four times a day (QID) | ORAL | Status: DC | PRN

## 2020-10-08 MED ORDER — KETOROLAC TROMETHAMINE 15 MG/ML IJ SOLN: 15.0000 mg | Freq: Once | INTRAMUSCULAR | Status: DC

## 2020-10-08 MED FILL — Fentanyl Citrate Preservative Free (PF) Inj 100 MCG/2ML: INTRAMUSCULAR | Qty: 2 | Status: AC

## 2020-10-08 SURGICAL SUPPLY — 52 items
BAG ZIPLOCK 12X15 (MISCELLANEOUS) ×2 IMPLANT
BIT DRILL 2.9 CANN QC NONSTRL (BIT) ×1 IMPLANT
BLADE SAW SGTL 11.0X1.19X90.0M (BLADE) IMPLANT
BNDG COHESIVE 6X5 TAN STRL LF (GAUZE/BANDAGES/DRESSINGS) ×2 IMPLANT
BNDG ELASTIC 6X5.8 VLCR STR LF (GAUZE/BANDAGES/DRESSINGS) ×1 IMPLANT
CLOTH BEACON ORANGE TIMEOUT ST (SAFETY) ×2 IMPLANT
COVER SURGICAL LIGHT HANDLE (MISCELLANEOUS) ×2 IMPLANT
COVER WAND RF STERILE (DRAPES) IMPLANT
CUFF TOURN SGL QUICK 34 (TOURNIQUET CUFF) ×2
CUFF TOURN SGL QUICK 42 (TOURNIQUET CUFF) IMPLANT
CUFF TRNQT CYL 34X4.125X (TOURNIQUET CUFF) IMPLANT
DECANTER SPIKE VIAL GLASS SM (MISCELLANEOUS) ×5 IMPLANT
DERMABOND ADVANCED (GAUZE/BANDAGES/DRESSINGS) ×1
DERMABOND ADVANCED .7 DNX12 (GAUZE/BANDAGES/DRESSINGS) ×2 IMPLANT
DRAPE C-ARM 42X120 X-RAY (DRAPES) ×1 IMPLANT
DRAPE C-ARMOR (DRAPES) ×1 IMPLANT
DRAPE U-SHAPE 47X51 STRL (DRAPES) ×2 IMPLANT
DRESSING AQUACEL AG SP 3.5X10 (GAUZE/BANDAGES/DRESSINGS) IMPLANT
DRSG AQUACEL AG ADV 3.5X10 (GAUZE/BANDAGES/DRESSINGS) ×2 IMPLANT
DRSG AQUACEL AG SP 3.5X10 (GAUZE/BANDAGES/DRESSINGS) ×2
DURAPREP 26ML APPLICATOR (WOUND CARE) ×2 IMPLANT
ELECT REM PT RETURN 15FT ADLT (MISCELLANEOUS) ×2 IMPLANT
GLOVE SURG ENC TEXT LTX SZ7 (GLOVE) IMPLANT
GLOVE SURG ORTHO LTX SZ7.5 (GLOVE) ×4 IMPLANT
GLOVE SURG ORTHO LTX SZ8 (GLOVE) ×2 IMPLANT
GLOVE SURG UNDER POLY LF SZ7.5 (GLOVE) ×2 IMPLANT
GOWN STRL REUS W/TWL LRG LVL3 (GOWN DISPOSABLE) ×2 IMPLANT
K-WIRE ACE 1.6X6 (WIRE) ×6
KIT TURNOVER KIT A (KITS) ×2 IMPLANT
KWIRE ACE 1.6X6 (WIRE) IMPLANT
MANIFOLD NEPTUNE II (INSTRUMENTS) ×2 IMPLANT
NS IRRIG 1000ML POUR BTL (IV SOLUTION) ×2 IMPLANT
PACK TOTAL KNEE CUSTOM (KITS) ×2 IMPLANT
PADDING CAST COTTON 6X4 STRL (CAST SUPPLIES) ×1 IMPLANT
PASSER SUT SWANSON 36MM LOOP (INSTRUMENTS) ×3 IMPLANT
PENCIL SMOKE EVACUATOR (MISCELLANEOUS) IMPLANT
PROTECTOR NERVE ULNAR (MISCELLANEOUS) ×2 IMPLANT
SCREW ACE CAN 4.0 30M (Screw) ×2 IMPLANT
STAPLER VISISTAT (STAPLE) ×1 IMPLANT
SUT ETHIBOND NAB CT1 #1 30IN (SUTURE) ×2 IMPLANT
SUT FIBERWIRE #2 38 T-5 BLUE (SUTURE)
SUT MAXBRAID #2 CVD NDL (SUTURE) ×1 IMPLANT
SUT MNCRL AB 4-0 PS2 18 (SUTURE) ×2 IMPLANT
SUT STRATAFIX PDS+ 0 24IN (SUTURE) ×1 IMPLANT
SUT VIC AB 0 CT1 27 (SUTURE)
SUT VIC AB 0 CT1 27XBRD ANTBC (SUTURE) ×2 IMPLANT
SUT VIC AB 1 CT1 27 (SUTURE) ×4
SUT VIC AB 1 CT1 27XBRD ANTBC (SUTURE) ×2 IMPLANT
SUT VIC AB 2-0 CT1 27 (SUTURE) ×4
SUT VIC AB 2-0 CT1 TAPERPNT 27 (SUTURE) ×2 IMPLANT
SUTURE FIBERWR #2 38 T-5 BLUE (SUTURE) ×2 IMPLANT
WATER STERILE IRR 1000ML POUR (IV SOLUTION) ×1 IMPLANT

## 2020-10-08 NOTE — Progress Notes (Signed)
Orthopedic Tech Progress Note Patient Details:  Kelly Hahn 1950/08/13 [id number] Bledsoe Brace has been ordered  Patient ID: Kelly Hahn, female   DOB: [birth year], 70 y.o.   MRN: [id number]   Kelly Hahn 10/08/2020, 7:16 PM

## 2020-10-08 NOTE — Brief Op Note (Signed)
10/06/2020 - 10/08/2020  5:20 PM  PATIENT:  Kelly Hahn  70 y.o. female  PRE-OPERATIVE DIAGNOSIS:  Closed right transverse comminuted patella fracture  POST-OPERATIVE DIAGNOSIS:  Closed right transverse comminuted patella fracture  PROCEDURE:  Procedure(s): OPEN REDUCTION INTERNAL (ORIF) FIXATION PATELLA (Right)  SURGEON:  Surgeon(s) and Role:    Paralee Cancel, MD - Primary  PHYSICIAN ASSISTANT: Griffith Citron, PA-C  ANESTHESIA:   general  EBL:  <20 cc  BLOOD ADMINISTERED:none  DRAINS: none   LOCAL MEDICATIONS USED:  MARCAINE     SPECIMEN:  No Specimen  DISPOSITION OF SPECIMEN:  N/A  COUNTS:  YES  TOURNIQUET:  49 min at 250 mmHg  DICTATION: .Other Dictation: Dictation Number [id number]  PLAN OF CARE: Admit to inpatient   PATIENT DISPOSITION:  PACU - hemodynamically stable.   Delay start of Pharmacological VTE agent (>24hrs) due to surgical blood loss or risk of bleeding: no

## 2020-10-08 NOTE — Interval H&P Note (Signed)
History and Physical Interval Note:  10/08/2020 2:32 PM  Kelly Hahn  has presented today for surgery, with the diagnosis of RIGHT PATELLA FRACTURE.  The various methods of treatment have been discussed with the patient and family. After consideration of risks, benefits and other options for treatment, the patient has consented to  Procedure(s): OPEN REDUCTION INTERNAL (ORIF) FIXATION PATELLA (Right) as a surgical intervention.  The patient's history has been reviewed, patient examined, no change in status, stable for surgery.  I have reviewed the patient's chart and labs.  Questions were answered to the patient's satisfaction.     [name]

## 2020-10-08 NOTE — H&P (View-Only) (Signed)
Patient ID: Kelly Hahn, female   DOB: [birth year], 70 y.o.   MRN: [id number]  Comfortable Ready for OR today  Exam unchanged  Assessment: Closed displaced right patella fracture  Plan: To OR today for ORIF right patella NPO Post op will order T-ROM brace locked in extension PT orders WBAT, no flexion Home needs to be determined including wheelchair with leg extension +/- home health PT

## 2020-10-08 NOTE — Anesthesia Postprocedure Evaluation (Signed)
Anesthesia Post Note  Patient: Kelly Hahn  Procedure(s) Performed: OPEN REDUCTION INTERNAL (ORIF) FIXATION PATELLA (Right Knee)     Patient location during evaluation: PACU Anesthesia Type: General Level of consciousness: awake Pain management: pain level controlled Vital Signs Assessment: post-procedure vital signs reviewed and stable Respiratory status: spontaneous breathing Cardiovascular status: stable Postop Assessment: no apparent nausea or vomiting Anesthetic complications: no   No complications documented.  Last Vitals:  Vitals:   10/08/20 1800 10/08/20 1815  BP: (!) 94/53 98/61  Pulse: 81 87  Resp: 15 15  Temp:    SpO2: 96% 94%    Last Pain:  Vitals:   10/08/20 1815  TempSrc:   PainSc: 0-No pain                 Huston Foley

## 2020-10-08 NOTE — Progress Notes (Signed)
Patient ID: Kelly Hahn, female   DOB: 07/24/1950, 69 y.o.   MRN: [id number]  Comfortable Ready for OR today  Exam unchanged  Assessment: Closed displaced right patella fracture  Plan: To OR today for ORIF right patella NPO Post op will order T-ROM brace locked in extension PT orders WBAT, no flexion Home needs to be determined including wheelchair with leg extension +/- home health PT  

## 2020-10-08 NOTE — TOC Initial Note (Signed)
Transition of Care [facility]) - Initial/Assessment Note   Patient Details  Name: Kelly Hahn MRN: [id number] Date of Birth: April 23, 1951  Transition of Care [facility]) CM/SW Contact:    Sherie Don, LCSW Phone Number: 10/08/2020, 1:19 PM  Clinical Narrative: Patient is a 70 year old female who was admitted for a closed displaced fracture of right patella. Patient requested to speak with CSW regarding DME needs. CSW met with patient and patient reported she will need a rolling walker and 3N1 as she does not have any DME at home. Patient also asked about an ice machine. Patient scheduled for surgery today. TOC awaiting PT recommendations for PT and DME.  Expected Discharge Plan: [place] Barriers to Discharge: Continued Medical Work up  Patient Goals and CMS Choice Patient states their goals for this hospitalization and ongoing recovery are:: Have surgery and get needed DME to safely return home CMS Medicare.gov Compare Post Acute Care list provided to:: Patient Choice offered to / list presented to : Patient  Expected Discharge Plan and Services Expected Discharge Plan: [place] In-house Referral: Clinical Social Work Post Acute Care Choice: Durable Medical Equipment Living arrangements for the past 2 months: Single Family Home  Prior Living Arrangements/Services Living arrangements for the past 2 months: Single Family Home Patient language and need for interpreter reviewed:: Yes Do you feel safe going back to the place where you live?: Yes      Need for Family Participation in Patient Care: No (Comment) Care giver support system in place?: Yes (comment) Criminal Activity/Legal Involvement Pertinent to Current Situation/Hospitalization: No - Comment as needed  Activities of Daily Living Home Assistive Devices/Equipment: None ADL Screening (condition at time of admission) Patient's cognitive ability adequate to safely complete daily activities?:  Yes Is the patient deaf or have difficulty hearing?: No Does the patient have difficulty seeing, even when wearing glasses/contacts?: No Does the patient have difficulty concentrating, remembering, or making decisions?: No Patient able to express need for assistance with ADLs?: No Does the patient have difficulty dressing or bathing?: No Independently performs ADLs?: Yes (appropriate for developmental age) Does the patient have difficulty walking or climbing stairs?: No Weakness of Legs: Right Weakness of Arms/Hands: None  Permission Sought/Granted Permission sought to share information with : Other (comment) Permission granted to share information with : Yes, Verbal Permission Granted Permission granted to share info w AGENCY: DME, HH companies  Emotional Assessment Appearance:: Appears stated age Attitude/Demeanor/Rapport: Engaged Affect (typically observed): Accepting Orientation: : Oriented to Self,Oriented to Place,Oriented to  Time,Oriented to Situation Alcohol / Substance Use: Not Applicable Psych Involvement: No (comment)  Admission diagnosis:  Patella fracture [S82.009A] Closed displaced transverse fracture of right patella with routine healing, subsequent encounter [S82.031D] Closed displaced fracture of right patella, unspecified fracture morphology, initial encounter [S82.001A] Patient Active Problem List   Diagnosis Date Noted  . Patella fracture 10/06/2020  . Closed displaced fracture of right patella, unspecified fracture morphology, initial encounter 10/06/2020  . Left patella fracture 03/03/2016  . Alopecia 05/03/2012  . Heartburn 02/26/2012  . Abnormal liver function test 12/08/2011  . PVC (premature ventricular contraction) 12/08/2011  . Goiter 12/08/2011  . Grief at loss of child 12/08/2011  . Hyperlipidemia 12/08/2011  . Snoring 09/17/2011  . Osteoporosis   . Endometriosis    PCP:  Leanna Battles, MD Pharmacy:   [place], [place]  [place] [address] Phone: [phone] Fax: [phone]  Readmission Risk Interventions No flowsheet data found.

## 2020-10-08 NOTE — Discharge Instructions (Signed)
INSTRUCTIONS AFTER SURGERY  o Remove items at home which could result in a fall. This includes throw rugs or furniture in walking pathways o ICE to the affected joint every three hours while awake for 30 minutes at a time, for at least the first 3-5 days, and then as needed for pain and swelling.  Continue to use ice for pain and swelling. You may notice swelling that will progress down to the foot and ankle.  This is normal after surgery.  Elevate your leg when you are not up walking on it.   o Continue to use the breathing machine you got in the hospital (incentive spirometer) which will help keep your temperature down.  It is common for your temperature to cycle up and down following surgery, especially at night when you are not up moving around and exerting yourself.  The breathing machine keeps your lungs expanded and your temperature down.   DIET:  As you were doing prior to hospitalization, we recommend a well-balanced diet.  DRESSING / WOUND CARE / SHOWERING  Keep the surgical dressing until follow up.  The dressing is water proof, so you can shower without any extra covering.  IF THE DRESSING FALLS OFF or the wound gets wet inside, change the dressing with sterile gauze.  Please use good hand washing techniques before changing the dressing.  Do not use any lotions or creams on the incision until instructed by your surgeon.    ACTIVITY  o Increase activity slowly as tolerated, but follow the weight bearing instructions below.   o No driving for 6 weeks or until further direction given by your physician.  You cannot drive while taking narcotics.  o No lifting or carrying greater than 10 lbs. until further directed by your surgeon. o Avoid periods of inactivity such as sitting longer than an hour when not asleep. This helps prevent blood clots.  o You may return to work once you are authorized by your doctor.     WEIGHT BEARING   Weight bearing as tolerated with assist device (walker,  cane, etc) as directed, use it as long as suggested by your surgeon or therapist, typically at least 4-6 weeks.   ** Do not bend the knee. Remain in brace at all times.**   CONSTIPATION  Constipation is defined medically as fewer than three stools per week and severe constipation as less than one stool per week.  Even if you have a regular bowel pattern at home, your normal regimen is likely to be disrupted due to multiple reasons following surgery.  Combination of anesthesia, postoperative narcotics, change in appetite and fluid intake all can affect your bowels.   YOU MUST use at least one of the following options; they are listed in order of increasing strength to get the job done.  They are all available over the counter, and you may need to use some, POSSIBLY even all of these options:    Drink plenty of fluids (prune juice may be helpful) and high fiber foods Colace 100 mg by mouth twice a day  Senokot for constipation as directed and as needed Dulcolax (bisacodyl), take with full glass of water  Miralax (polyethylene glycol) once or twice a day as needed.  If you have tried all these things and are unable to have a bowel movement in the first 3-4 days after surgery call either your surgeon or your primary doctor.    If you experience loose stools or diarrhea, hold the medications until  you stool forms back up.  If your symptoms do not get better within 1 week or if they get worse, check with your doctor.  If you experience "the worst abdominal pain ever" or develop nausea or vomiting, please contact the office immediately for further recommendations for treatment.   ITCHING:  If you experience itching with your medications, try taking only a single pain pill, or even half a pain pill at a time.  You can also use Benadryl over the counter for itching or also to help with sleep.   TED HOSE STOCKINGS:  Use stockings on both legs until for at least 2 weeks or as directed by physician  office. They may be removed at night for sleeping.  MEDICATIONS:  See your medication summary on the "After Visit Summary" that nursing will review with you.  You may have some home medications which will be placed on hold until you complete the course of blood thinner medication.  It is important for you to complete the blood thinner medication as prescribed.  PRECAUTIONS:  If you experience chest pain or shortness of breath - call 911 immediately for transfer to the hospital emergency department.   If you develop a fever greater that 101 F, purulent drainage from wound, increased redness or drainage from wound, foul odor from the wound/dressing, or calf pain - CONTACT YOUR SURGEON.                                                   FOLLOW-UP APPOINTMENTS:  If you do not already have a post-op appointment, please call the office for an appointment to be seen by your surgeon.  Guidelines for how soon to be seen are listed in your "After Visit Summary", but are typically between 1-4 weeks after surgery.  POST-OPERATIVE OPIOID TAPER INSTRUCTIONS: . It is important to wean off of your opioid medication as soon as possible. If you do not need pain medication after your surgery it is ok to stop day one. Marland Kitchen Opioids include: o Codeine, Hydrocodone(Norco, Vicodin), Oxycodone(Percocet, oxycontin) and hydromorphone amongst others.  . Long term and even short term use of opiods can cause: o Increased pain response o Dependence o Constipation o Depression o Respiratory depression o And more.  . Withdrawal symptoms can include o Flu like symptoms o Nausea, vomiting o And more . Techniques to manage these symptoms o Hydrate well o Eat regular healthy meals o Stay active o Use relaxation techniques(deep breathing, meditating, yoga) . Do Not substitute Alcohol to help with tapering . If you have been on opioids for less than two weeks and do not have pain than it is ok to stop all together.  . Plan to  wean off of opioids o This plan should start within one week post op of your joint replacement. o Maintain the same interval or time between taking each dose and first decrease the dose.  o Cut the total daily intake of opioids by one tablet each day o Next start to increase the time between doses. o The last dose that should be eliminated is the evening dose.     MAKE SURE YOU:  . Understand these instructions.  . Get help right away if you are not doing well or get worse.    Thank you for letting us be a part of your  medical care team.  It is a privilege we respect greatly.  We hope these instructions will help you stay on track for a fast and full recovery!

## 2020-10-08 NOTE — Anesthesia Preprocedure Evaluation (Signed)
Anesthesia Evaluation  Patient identified by MRN, date of birth, ID band Patient awake    Reviewed: Allergy & Precautions, NPO status , Patient's Chart, lab work & pertinent test results  Airway Mallampati: I       Dental no notable dental hx.    Pulmonary neg pulmonary ROS,    Pulmonary exam normal        Cardiovascular negative cardio ROS Normal cardiovascular exam     Neuro/Psych negative neurological ROS  negative psych ROS   GI/Hepatic negative GI ROS, Neg liver ROS,   Endo/Other  negative endocrine ROS  Renal/GU negative Renal ROS  negative genitourinary   Musculoskeletal   Abdominal Normal abdominal exam  (+)   Peds  Hematology negative hematology ROS (+)   Anesthesia Other Findings   Reproductive/Obstetrics                             Anesthesia Physical Anesthesia Plan  ASA: I  Anesthesia Plan: General   Post-op Pain Management:  Regional for Post-op pain   Induction: Intravenous  PONV Risk Score and Plan: 3 and Ondansetron, Dexamethasone and Midazolam  Airway Management Planned: LMA  Additional Equipment: None  Intra-op Plan:   Post-operative Plan: Extubation in OR  Informed Consent: I have reviewed the patients History and Physical, chart, labs and discussed the procedure including the risks, benefits and alternatives for the proposed anesthesia with the patient or authorized representative who has indicated his/her understanding and acceptance.     Dental advisory given  Plan Discussed with: CRNA  Anesthesia Plan Comments:         Anesthesia Quick Evaluation

## 2020-10-08 NOTE — Anesthesia Procedure Notes (Signed)
Anesthesia Regional Block: Femoral nerve block   Pre-Anesthetic Checklist: ,, timeout performed, Correct Patient, Correct Site, Correct Laterality, Correct Procedure, Correct Position, site marked, Risks and benefits discussed,  Surgical consent,  Pre-op evaluation,  At surgeon's request and post-op pain management  Laterality: Right and Lower  Prep: Maximum Sterile Barrier Precautions used, chloraprep       Needles:  Injection technique: Single-shot  Needle Type: Echogenic Needle     Needle Length: 9cm  Needle Gauge: 20   Needle insertion depth: 2 cm   Additional Needles:   Procedures:,,,, ultrasound used (permanent image in chart),,,,  Narrative:  Start time: 10/08/2020 3:40 PM End time: 10/08/2020 3:50 PM Injection made incrementally with aspirations every 5 mL.  Performed by: Personally  Anesthesiologist: Lyn Hollingshead, MD

## 2020-10-08 NOTE — Transfer of Care (Signed)
Immediate Anesthesia Transfer of Care Note  Patient: Kelly Hahn  Procedure(s) Performed: OPEN REDUCTION INTERNAL (ORIF) FIXATION PATELLA (Right Knee)  Patient Location: PACU  Anesthesia Type:General  Level of Consciousness: drowsy  Airway & Oxygen Therapy: Patient Spontanous Breathing and Patient connected to face mask oxygen  Post-op Assessment: Report given to RN and Post -op Vital signs reviewed and stable  Post vital signs: Reviewed and stable  Last Vitals:  Vitals Value Taken Time  BP 94/51 10/08/20 1740  Temp    Pulse 89 10/08/20 1743  Resp 14 10/08/20 1743  SpO2 98 % 10/08/20 1743  Vitals shown include unvalidated device data.  Last Pain:  Vitals:   10/08/20 1200  TempSrc:   PainSc: 4       Patients Stated Pain Goal: 2 (75/88/32 5498)  Complications: No complications documented.

## 2020-10-08 NOTE — Plan of Care (Signed)
  Problem: Pain Managment: Goal: General experience of comfort will improve Outcome: Progressing   Problem: Safety: Goal: Ability to remain free from injury will improve Outcome: Progressing   Problem: Coping: Goal: Level of anxiety will decrease Outcome: Progressing   

## 2020-10-08 NOTE — Progress Notes (Signed)
Assisted Dr. Lyn Hollingshead with Right Femoral  block. Side rails up, monitors on throughout procedure. See vital signs in flow sheet. Tolerated Procedure well.

## 2020-10-08 NOTE — Anesthesia Procedure Notes (Signed)
Procedure Name: LMA Insertion °Performed by: Daryan Cagley H, CRNA °Pre-anesthesia Checklist: Patient identified, Emergency Drugs available, Suction available and Patient being monitored °Patient Re-evaluated:Patient Re-evaluated prior to induction °Oxygen Delivery Method: Circle System Utilized °Preoxygenation: Pre-oxygenation with 100% oxygen °Induction Type: IV induction °Ventilation: Mask ventilation without difficulty °LMA: LMA inserted °LMA Size: 4.0 °Number of attempts: 1 °Airway Equipment and Method: Bite block °Placement Confirmation: positive ETCO2 °Tube secured with: Tape °Dental Injury: Teeth and Oropharynx as per pre-operative assessment  ° ° ° ° ° ° °

## 2020-10-09 LAB — CBC
HCT: 32.7 % — ABNORMAL LOW (ref 36.0–46.0)
Hemoglobin: 10.1 g/dL — ABNORMAL LOW (ref 12.0–15.0)
MCH: 26.9 pg (ref 26.0–34.0)
MCHC: 30.9 g/dL (ref 30.0–36.0)
MCV: 87.2 fL (ref 80.0–100.0)
Platelets: 224 10*3/uL (ref 150–400)
RBC: 3.75 MIL/uL — ABNORMAL LOW (ref 3.87–5.11)
RDW: 14.1 % (ref 11.5–15.5)
WBC: 8.8 10*3/uL (ref 4.0–10.5)
nRBC: 0 % (ref 0.0–0.2)

## 2020-10-09 LAB — BASIC METABOLIC PANEL
Anion gap: 9 (ref 5–15)
BUN: 16 mg/dL (ref 8–23)
CO2: 23 mmol/L (ref 22–32)
Calcium: 8.5 mg/dL — ABNORMAL LOW (ref 8.9–10.3)
Chloride: 103 mmol/L (ref 98–111)
Creatinine, Ser: 0.46 mg/dL (ref 0.44–1.00)
GFR, Estimated: >60 mL/min (ref >60)
Glucose, Bld: 190 mg/dL — ABNORMAL HIGH (ref 70–99)
Potassium: 3.8 mmol/L (ref 3.5–5.1)
Sodium: 135 mmol/L (ref 135–145)

## 2020-10-09 MED ORDER — SODIUM CHLORIDE 0.9 % IV BOLUS
250.0000 mL | Freq: Once | INTRAVENOUS | Status: AC
  Administered 2020-10-09: 250 mL via INTRAVENOUS

## 2020-10-09 MED ORDER — LIP MEDEX EX OINT
TOPICAL_OINTMENT | CUTANEOUS | Status: AC
  Administered 2020-10-09: 1
  Filled 2020-10-09: qty 7

## 2020-10-09 NOTE — Plan of Care (Signed)
Plan of care reviewed and discussed with the patient. 

## 2020-10-09 NOTE — Op Note (Signed)
NAME: Kelly Hahn, Kelly Hahn MEDICAL RECORD NO: [id number] ACCOUNT NO: [id number] DATE OF BIRTH: 13-Sep-1950 FACILITY: [id number] PHYSICIAN: Pietro Cassis. Alvan Dame, MD  Operative Report   DATE OF PROCEDURE: 10/08/2020   PREOPERATIVE DIAGNOSIS:  Closed displaced comminuted transverse right patellar fracture.  POSTOPERATIVE DIAGNOSIS:  Closed displaced comminuted transverse right patellar fracture.  PROCEDURE:  Open reduction internal fixation of right patellar fracture.  COMPONENTS USED:  Two 30 mm partially threaded 4.0 mm cannulated screws were used, placed in a longitudinally parallel position through the patella fracture and supported with #2 fiber based suture.  SURGEON:  Pietro Cassis. Alvan Dame, MD   ASSISTANT:  Asencion Partridge, PA-C.  Note that Ms. Kelly Hahn was present for the entirety of the case for preoperative positioning, perioperative management of the operative extremity and general facilitation of the case and primary wound closure.  ANESTHESIA:  General.  ESTIMATED BLOOD LOSS:  Minimal.  TOURNIQUET TIME:  49 minutes at 250 mmHg.  INDICATIONS:  The patient is a pleasant 70 year old female known to me from a prior left comminuted patellar fracture.  She underwent an open reduction and internal fixation for this about 5 years ago and successfully made through physical therapy.  She  unfortunately this past weekend was at [facility] for commencement exercises when she slipped and fell onto her right knee.  She had immediate onset of pain and inability to bear weight.  She was taken to the emergency room where radiographs  revealed a displaced patellar fracture.  Per her request, she was transferred to [facility] to be under our care.  We reviewed the indications for the procedure.  We discussed the postoperative course.  We discussed the risks of nonunion,  malunion, need for future surgeries in addition to infection and DVT.  Consent was obtained for management of  the patellar fracture.  DESCRIPTION OF PROCEDURE:  The patient was brought to the operative theater.  Once adequate anesthesia and preoperative antibiotics, Ancef administered, she was positioned supine with a right thigh tourniquet placed.  The right lower extremity was then  prepped and draped in sterile fashion.  A time-out was performed identifying the patient, planned procedure, and extremity.  Landmarks were identified on her knee.  Leg was exsanguinated and tourniquet elevated to 250 mmHg.  An incision was made on the  midline.  The soft tissue exposure was carried down to the patella.  I was able to elevate prepatellar retinacular tissue off of the patella and preserve this layer.  I then identified the fracture site.  We did identify the comminution to the segments  of the predominant pattern was a transverse pattern.  We evacuated hematoma from the knee by opening the knee up through the fracture site.  I debrided any nonviable appearing soft tissues.  I then used a bone tenaculum and under fluoroscopic imaging  confirmed reduction of the fracture.  I then placed guidewires from distal to proximal across the fracture site.  Once I confirmed the orientation, I measured and selected 30 mm screws.  We drilled with a 2.9 mm cannulated drill bit and then placed the  screws.  Again, fluoroscopy was used in AP and lateral planes to confirm maintenance of the reduction and orientation of the screws.  I then used #2 Fiber based suture and passed these through the cannulated screws and then wrapped it around over top of  the fracture and the anterior aspect of the patella in a figure-of-eight fashion.  We then confirmed the fracture  appeared to be in a very nearly complete anatomic position.  Once this was done, I reapproximated the anterior based retinacular tissue over  the repair and fracture site.  This provided a watertight seal.  I then injected the knee with Marcaine in the pericapsular tissues.  The  tourniquet was let down after 49 minutes.  The remainder of the wound was closed with 2-0 Vicryl and a running  Monocryl stitch.  The knee was then cleaned, dried and dressed sterilely using surgical glue and Aquacel dressing.  She was then brought to the recovery room in a knee immobilizer.  We will need to keep her knee immobilized for 4-6 weeks.  She will be placed into a T-ROM brace locked in extension.  She will be allowed to be weightbearing as tolerated depending on her functional ability.  Discharge will be over the next couple of  days.   SUJ D: 10/08/2020 5:27:37 pm T: 10/09/2020 3:30:00 am  JOB: [id number]/ [id number]

## 2020-10-09 NOTE — Evaluation (Addendum)
Physical Therapy Evaluation Patient Details Name: Kelly Hahn MRN: [id number] DOB: [birth year] Today's Date: 10/09/2020   History of Present Illness  70 y.o. female admitted 10/08/20 with R patella fx, s/p ORIF. PMH of L patella fx 2017 s/p ORIF.  Clinical Impression  Pt admitted with above diagnosis. Pt ambulated 100' with RW, no loss of balance. Ongoing verbal cues for sequencing and frequent redirection required as pt is easily distracted. Will plan to do stair training next session.  Pt currently with functional limitations due to the deficits listed below (see PT Problem List). Pt will benefit from skilled PT to increase their independence and safety with mobility to allow discharge to the venue listed below.       Follow Up Recommendations Home health PT;Follow surgeon's recommendation for DC plan and follow-up therapies    Equipment Recommendations  Rolling walker with 5" wheels;3in1 (PT)    Recommendations for Other Services       Precautions / Restrictions Precautions Precautions: Fall Required Braces or Orthoses: Other Brace Knee Immobilizer - Right: On at all times Other Brace: bledsoe locked in extension at all times R knee Restrictions Weight Bearing Restrictions: No Other Position/Activity Restrictions: WBAT      Mobility  Bed Mobility               General bed mobility comments: up at edge of bed    Transfers Overall transfer level: Needs assistance Equipment used: Rolling walker (2 wheeled) Transfers: Sit to/from Stand Sit to Stand: Min guard         General transfer comment: VCs hand placement  Ambulation/Gait Ambulation/Gait assistance: Min guard Gait Distance (Feet): 100 Feet Assistive device: Rolling walker (2 wheeled) Gait Pattern/deviations: Step-to pattern;Decreased step length - right;Decreased step length - left Gait velocity: decr   General Gait Details: VCs sequencing, no loss of balance, VCs to slow down and for  positioning in RW, pt somewhat impulsive, required ongoing cues for sequencing  Stairs            Wheelchair Mobility    Modified Rankin (Stroke Patients Only)       Balance Overall balance assessment: Needs assistance   Sitting balance-Leahy Scale: Good     Standing balance support: Bilateral upper extremity supported Standing balance-Leahy Scale: Fair                               Pertinent Vitals/Pain      Home Living Family/patient expects to be discharged to:: Private residence Living Arrangements: Spouse/significant other Available Help at Discharge: Friend(s);Available PRN/intermittently Type of Home: House Home Access: Stairs to enter Entrance Stairs-Rails: Right Entrance Stairs-Number of Steps: 3 Home Layout: Two level;Able to live on main level with bedroom/bathroom Home Equipment: Kasandra Knudsen - single point Additional Comments: husband has cancer, he cannot drive nor assist, friends will come by to assist and provide transportation    Prior Function Level of Independence: Independent               Hand Dominance        Extremity/Trunk Assessment   Upper Extremity Assessment Upper Extremity Assessment: Overall WFL for tasks assessed    Lower Extremity Assessment Lower Extremity Assessment: RLE deficits/detail RLE: Unable to fully assess due to immobilization RLE Sensation: WNL    Cervical / Trunk Assessment Cervical / Trunk Assessment: Normal  Communication   Communication: No difficulties  Cognition Arousal/Alertness: Awake/alert Behavior During Therapy: [facility] for tasks  assessed/performed Overall Cognitive Status: Within Functional Limits for tasks assessed                                        General Comments      Exercises Total Joint Exercises Ankle Circles/Pumps: AROM;Both;10 reps;Supine   Assessment/Plan    PT Assessment Patient needs continued PT services  PT Problem List Decreased  mobility;Decreased activity tolerance;Decreased balance;Decreased knowledge of use of DME;Pain       PT Treatment Interventions Gait training;Therapeutic activities;DME instruction;Therapeutic exercise;Functional mobility training;Patient/family education    PT Goals (Current goals can be found in the Care Plan section)  Acute Rehab PT Goals Patient Stated Goal: walk farther PT Goal Formulation: With patient Time For Goal Achievement: 10/23/20 Potential to Achieve Goals: Good    Frequency Min 6X/week   Barriers to discharge        Co-evaluation               AM-PAC PT "6 Clicks" Mobility  Outcome Measure Help needed turning from your back to your side while in a flat bed without using bedrails?: None Help needed moving from lying on your back to sitting on the side of a flat bed without using bedrails?: A Little Help needed moving to and from a bed to a chair (including a wheelchair)?: A Little Help needed standing up from a chair using your arms (e.g., wheelchair or bedside chair)?: A Little Help needed to walk in hospital room?: A Little Help needed climbing 3-5 steps with a railing? : A Little 6 Click Score: 19    End of Session Equipment Utilized During Treatment: Gait belt Activity Tolerance: Patient tolerated treatment well Patient left: in chair;with call bell/phone within reach;with chair alarm set;with nursing/sitter in room Nurse Communication: Mobility status PT Visit Diagnosis: Unsteadiness on feet (R26.81);Difficulty in walking, not elsewhere classified (R26.2);Pain Pain - Right/Left: Right Pain - part of body: Knee    Time: 0923-0949 PT Time Calculation (min) (ACUTE ONLY): 26 min   Charges:   PT Evaluation $PT Eval Moderate Complexity: 1 Mod PT Treatments $Gait Training: 8-22 mins        Blondell Reveal Kistler PT 10/09/2020  Acute Rehabilitation Services Pager [phone] Office [phone]

## 2020-10-09 NOTE — Progress Notes (Signed)
BP 88/56, Theresa Duty, PA-C notifed... Orders received, please see MAR.

## 2020-10-09 NOTE — TOC Progression Note (Signed)
Transition of Care [facility]) - Progression Note   Patient Details  Name: Kelly Hahn MRN: [id number] Date of Birth: March 31, 1951  [facility] CM/SW [place], LCSW Phone Number: 10/09/2020, 2:57 PM  Clinical Narrative: PT evaluation recommended HHPT. Patient is agreeable to HHPT referral. Patient will need a rolling walker and 3N1. Patient expected to discharge tomorrow and is agreeable to [facility] delivering DME in the morning. DME referral made to [facility] with [employer].  Expected Discharge Plan: [place] Barriers to Discharge: Continued Medical Work up  Expected Discharge Plan and Services Expected Discharge Plan: [place] In-house Referral: Clinical Social Work Post Acute Care Choice: Durable Medical Equipment Living arrangements for the past 2 months: Single Family Home              DME Arranged: 3-N-1,Walker rolling DME Agency: [employer] Date DME Agency Contacted: 10/09/20 Time DME Agency Contacted: 1059 Representative spoke with at DME Agency: Ovid Curd  Readmission Risk Interventions No flowsheet data found.

## 2020-10-09 NOTE — Progress Notes (Signed)
NS 250 cc bolus given.  BP: 103/61.  Will continue to monitor BP.

## 2020-10-09 NOTE — Progress Notes (Signed)
   Subjective: 1 Day Post-Op Procedure(s) (LRB): OPEN REDUCTION INTERNAL (ORIF) FIXATION PATELLA (Right) Patient reports pain as mild.   Patient seen in rounds by Dr. Alvan Dame. Patient is well, and has had no acute complaints or problems. Patient had hypotensive episode yesterday, which resolved with NS bolus. Voiding without difficulty. She is being fitted for bledsoe brace.  We will start therapy today.   Objective: Vital signs in last 24 hours: Temp:  [97.5 F (36.4 C)-98.1 F (36.7 C)] 97.8 F (36.6 C) (05/11 0508) Pulse Rate:  [54-92] 54 (05/11 0508) Resp:  [13-23] 17 (05/11 0508) BP: (88-138)/(51-72) 91/63 (05/11 0508) SpO2:  [93 %-100 %] 95 % (05/11 0508)  Intake/Output from previous day:  Intake/Output Summary (Last 24 hours) at 10/09/2020 0830 Last data filed at 10/09/2020 0600 Gross per 24 hour  Intake 2765.69 ml  Output 875 ml  Net 1890.69 ml     Intake/Output this shift: No intake/output data recorded.  Labs: Recent Labs    10/06/20 2242 10/09/20 0346  HGB 12.4 10.1*   Recent Labs    10/06/20 2242 10/09/20 0346  WBC 9.8 8.8  RBC 4.64 3.75*  HCT 39.6 32.7*  PLT 296 224   Recent Labs    10/06/20 2242 10/09/20 0346  NA  --  135  K  --  3.8  CL  --  103  CO2  --  23  BUN  --  16  CREATININE 0.69 0.46  GLUCOSE  --  190*  CALCIUM  --  8.5*   No results for input(s): LABPT, INR in the last 72 hours.  Exam: General - Patient is Alert and Oriented Extremity - Neurologically intact Sensation intact distally Intact pulses distally Dorsiflexion/Plantar flexion intact Dressing - dressing C/D/I Motor Function - intact, moving foot and toes well on exam.   Past Medical History:  Diagnosis Date  . Anemia   . Endometriosis   . Fall   . Fracture of arm   . Heart murmur   . High cholesterol   . Osteoporosis 07/2017   T score -3.0  . Palpitations   . Schatzki's ring   . Thyroid disease    Nodules    Assessment/Plan: 1 Day Post-Op  Procedure(s) (LRB): OPEN REDUCTION INTERNAL (ORIF) FIXATION PATELLA (Right) Active Problems:   Patella fracture   Closed displaced fracture of right patella, unspecified fracture morphology, initial encounter  Estimated body mass index is 20.94 kg/m as calculated from the following:   Height as of 04/22/20: 5\' 4"  (1.626 m).   Weight as of 04/22/20: 55.3 kg. Advance diet Up with therapy  DVT Prophylaxis - Aspirin Weight bearing as tolerated. Right knee to remain in knee immobilizer/bledsoe brace, locked in full extension at all times.   Plan is to go Home after hospital stay.  Patient had hypotensive episode yesterday, which resolved with NS bolus. Hemoglobin 10.1 this AM with minimal blood loss intra-operatively. This is more than likely a reaction to anesthesia. Will repeat NS bolus today.   Griffith Citron, PA-C Orthopedic Surgery [phone] 10/09/2020, 8:30 AM

## 2020-10-09 NOTE — Progress Notes (Signed)
   10/09/20 [place]  Was fall witnessed? No  Was patient injured? No  Patient found on floor  Found by Staff-comment  Stated prior activity ambulating-unassisted  Follow Up  MD notified Griffith Citron PA  Time MD notified [phone]  Family notified Yes - comment (pt's husband present in room)  Time family notified  (he was present in room)  Additional tests No  Simple treatment Other (comment) (none, pt refused any treatment)  Progress note created (see row info) Yes  Adult Fall Risk Assessment  Risk Factor Category (scoring not indicated) High fall risk per protocol (document High fall risk)  Patient Fall Risk Level High fall risk  Adult Fall Risk Interventions  Required Bundle Interventions *See Row Information* High fall risk - low, moderate, and high requirements implemented  Additional Interventions Use of appropriate toileting equipment (bedpan, BSC, etc.);Room near nurses station;PT/OT need assessed if change in mobility from baseline  Screening for Fall Injury Risk (To be completed on HIGH fall risk patients) - Assessing Need for Floor Mats  Risk For Fall Injury- Criteria for Floor Mats Noncompliant with safety precautions  Will Implement Floor Mats Yes  Pain Assessment  Pain Scale 0-10  Pain Score 0

## 2020-10-09 NOTE — Care Management Important Message (Signed)
Important Message  Patient Details IM Letter given to the Patient. Name: Kelly Hahn MRN: [id number] Date of Birth: Jul 26, 1950   Medicare Important Message Given:  Yes     Kerin Salen 10/09/2020, 10:16 AM

## 2020-10-10 ENCOUNTER — Inpatient Hospital Stay (HOSPITAL_COMMUNITY): Payer: Medicare Other

## 2020-10-10 LAB — CBC
HCT: 30.7 % — ABNORMAL LOW (ref 36.0–46.0)
Hemoglobin: 9.9 g/dL — ABNORMAL LOW (ref 12.0–15.0)
MCH: 27.2 pg (ref 26.0–34.0)
MCHC: 32.2 g/dL (ref 30.0–36.0)
MCV: 84.3 fL (ref 80.0–100.0)
Platelets: 245 10*3/uL (ref 150–400)
RBC: 3.64 MIL/uL — ABNORMAL LOW (ref 3.87–5.11)
RDW: 14.3 % (ref 11.5–15.5)
WBC: 9.2 10*3/uL (ref 4.0–10.5)
nRBC: 0 % (ref 0.0–0.2)

## 2020-10-10 MED ORDER — POLYETHYLENE GLYCOL 3350 17 G PO PACK: 17.0000 g | PACK | Freq: Every day | ORAL | 0 refills | Status: DC | PRN

## 2020-10-10 MED ORDER — METHOCARBAMOL 500 MG PO TABS: 500.0000 mg | ORAL_TABLET | Freq: Four times a day (QID) | ORAL | 0 refills | Status: DC | PRN

## 2020-10-10 MED ORDER — ASPIRIN 81 MG PO CHEW: 81.0000 mg | CHEWABLE_TABLET | Freq: Two times a day (BID) | ORAL | 0 refills | Status: AC

## 2020-10-10 MED ORDER — KETOROLAC TROMETHAMINE 15 MG/ML IJ SOLN
7.5000 mg | Freq: Four times a day (QID) | INTRAMUSCULAR | Status: DC
  Administered 2020-10-10: 7.5 mg via INTRAVENOUS
  Filled 2020-10-10: qty 1

## 2020-10-10 MED ORDER — OXYCODONE HCL 5 MG PO TABS: 5.0000 mg | ORAL_TABLET | Freq: Four times a day (QID) | ORAL | 0 refills | Status: DC | PRN

## 2020-10-10 MED ORDER — DOCUSATE SODIUM 100 MG PO CAPS: 100.0000 mg | ORAL_CAPSULE | Freq: Two times a day (BID) | ORAL | 0 refills | Status: DC

## 2020-10-10 NOTE — Progress Notes (Signed)
Physical Therapy Treatment Patient Details Name: Kelly Hahn MRN: [id number] DOB: Jul 07, 1950 Today's Date: 10/10/2020    History of Present Illness 70 y.o. female admitted 10/08/20 with R patella fx, s/p ORIF. PMH of L patella fx 2017 s/p ORIF.    PT Comments    Noted pt had a fall while walking unassisted yesterday. Imaging showed no change in hardware nor in patella alignment, but did show soft tissue edema. Pt ambulated 14' with RW, distance limited by pain. Stair training completed. She is ready to DC home from a PT standpoint.    Follow Up Recommendations  Home health PT;Follow surgeon's recommendation for DC plan and follow-up therapies     Equipment Recommendations  Rolling walker with 5" wheels;3in1 (PT);Wheelchair (measurements PT);Wheelchair cushion (measurements PT)    Recommendations for Other Services       Precautions / Restrictions Precautions Precautions: Fall Precaution Comments: noted pt had a fall yesterday while walking unassisted, xray showed no changes in hardware nor in alignment of patella Required Braces or Orthoses: Other Brace Knee Immobilizer - Right: On at all times Other Brace: bledsoe locked in extension at all times R knee Restrictions Weight Bearing Restrictions: No Other Position/Activity Restrictions: WBAT    Mobility  Bed Mobility Overal bed mobility: Modified Independent                  Transfers Overall transfer level: Needs assistance Equipment used: Rolling walker (2 wheeled) Transfers: Sit to/from Stand Sit to Stand: Min guard         General transfer comment: VCs hand placement and to fully back up to chair/commode prior to sitting  Ambulation/Gait Ambulation/Gait assistance: Min guard Gait Distance (Feet): 80 Feet Assistive device: Rolling walker (2 wheeled) Gait Pattern/deviations: Step-to pattern;Decreased step length - right;Decreased step length - left Gait velocity: decr   General Gait Details: no  loss of balance, distance limited by pain   Stairs Stairs: Yes Stairs assistance: Min guard Stair Management: Two rails;Forwards;Step to pattern Number of Stairs: 3 General stair comments: VCs sequencing   Wheelchair Mobility    Modified Rankin (Stroke Patients Only)       Balance Overall balance assessment: Needs assistance   Sitting balance-Leahy Scale: Good     Standing balance support: Bilateral upper extremity supported Standing balance-Leahy Scale: Fair                              Cognition                                              Exercises      General Comments        Pertinent Vitals/Pain Pain Assessment: 0-10 Pain Score: 8  Pain Location: r knee Pain Descriptors / Indicators: Sore    Home Living                      Prior Function            PT Goals (current goals can now be found in the care plan section) Acute Rehab PT Goals Patient Stated Goal: walk farther, return to exercise class, golf PT Goal Formulation: With patient Time For Goal Achievement: 10/23/20 Potential to Achieve Goals: Good Progress towards PT goals: Progressing toward goals    Frequency  Min 6X/week      PT Plan Current plan remains appropriate    Co-evaluation              AM-PAC PT "6 Clicks" Mobility   Outcome Measure  Help needed turning from your back to your side while in a flat bed without using bedrails?: None Help needed moving from lying on your back to sitting on the side of a flat bed without using bedrails?: A Little Help needed moving to and from a bed to a chair (including a wheelchair)?: A Little Help needed standing up from a chair using your arms (e.g., wheelchair or bedside chair)?: A Little Help needed to walk in hospital room?: A Little Help needed climbing 3-5 steps with a railing? : A Little 6 Click Score: 19    End of Session Equipment Utilized During Treatment: Gait belt;Other  (comment) (R bledsoe brace) Activity Tolerance: Patient tolerated treatment well Patient left: in chair;with call bell/phone within reach;with chair alarm set Nurse Communication: Mobility status PT Visit Diagnosis: Unsteadiness on feet (R26.81);Difficulty in walking, not elsewhere classified (R26.2);Pain Pain - Right/Left: Right Pain - part of body: Knee     Time: 6270-3500 PT Time Calculation (min) (ACUTE ONLY): 21 min  Charges:  $Gait Training: 8-22 mins                     Blondell Reveal Kistler PT 10/10/2020  Acute Rehabilitation Services Pager [phone] Office [phone]

## 2020-10-10 NOTE — Plan of Care (Signed)
Patient discharged home in stable condition 

## 2020-10-10 NOTE — Plan of Care (Signed)
Plan of care reviewed and discussed with the patient. 

## 2020-10-10 NOTE — TOC Transition Note (Addendum)
Transition of Care [facility]) - CM/SW Discharge Note  Patient Details  Name: [name] MRN: [id number] Date of Birth: [birth year]  [facility] of Care [facility] CM/SW Contact:  Sherie Don, LCSW Phone Number: 10/10/2020, 10:13 AM  Clinical Narrative: Patient expected to discharge home. Referral made to [place] ([place] for HHPT. [employer] delivered rolling walker and 3N1 to patient's room. CSW updated patient regarding HHPT. HH added to AVS. TOC signing off.  Addendum: Patient will also require a wheelchair per PA. Order has been placed. [employer] does not provide wheelchairs, so referral was made to [facility] with [employer]. [employer] to deliver the wheelchair to patient's room.  Final next level of care: Skilled Nursing Facility Barriers to Discharge: Barriers Resolved  Patient Goals and CMS Choice Patient states their goals for this hospitalization and ongoing recovery are:: Have surgery and get needed DME to safely return home CMS Medicare.gov Compare Post Acute Care list provided to:: Patient Choice offered to / list presented to : Patient  Discharge Plan and Services In-house Referral: Clinical Social Work Post Acute Care Choice: Durable Medical Equipment          DME Arranged: 3-N-1,Walker rolling DME Agency: [employer] Date DME Agency Contacted: 10/09/20 Time DME Agency Contacted: 1059 Representative spoke with at DME Agency: Ovid Curd HH Arranged: PT [place]: [facility] [employer] Date [place]: 10/10/20 Time [place]: [phone] Representative spoke with at [place]: Ronalee Belts  Readmission Risk Interventions No flowsheet data found.

## 2020-10-10 NOTE — Progress Notes (Signed)
The patient was very upset re: bed alarm.  States, " Well, since I can't move in this bed without setting off the alarm, I'm gonna just turn the light back on and just stay in the middle of the bed and not sleep."  Encouraged her to try moving from side to side to see how much she could move before the alarm sounded.  She continued to complain that the bed alarm would prevent her from being able to sleep.  I told her, due to her fall earlier in the day, her safety was our top priority, and I wouldn't compromise Safety over her being able to sleep without the bed alarm going off.  She became very upset, saying, " We are not going to keep rehashing what happened today.  If that girl (NT) would have just stayed in my room, I would not have fallen, and would have to deal with this stupid alarm.  You don't need to yell at me about it. Offered assurance to her that I wasn't yelling at her, I was just trying to discuss the rationale of the bed alarm.  She told me to just stop talking about it, that this was the most unprofessional staff and hospital she had ever seen.  I apologized to her again, told her I was sorry it seemed that way, and the we were most concerned with her safety and well being.  She refused to acknowledge my offer, asked that I just let it go. I told her to call if there was anything she needed or that we could do for her and reminded her to not get OOB without calling for assistance.  She did not acknowledge understanding, nor agreement, with what I had told her.  Bed alarm on, call light within reach.

## 2020-10-11 ENCOUNTER — Encounter (HOSPITAL_COMMUNITY): Payer: Self-pay | Admitting: Orthopedic Surgery

## 2020-10-11 NOTE — Progress Notes (Signed)
Subjective: 2 Days Post-Op Procedure(s) (LRB): OPEN REDUCTION INTERNAL (ORIF) FIXATION PATELLA (Right)    Patient reports pain as moderate. She had a challenging night last night raising concerns about staff and treatment. Events of her falling reviewed. Worried that based on pain medication use in addition to anxiety surrounding her current condition and the fact that has happened at a very inopportune time made it hard to cope with her current hospitalization. She wants to get home which is very understandable based on all that was discussed.   Objective:   VITALS:   Vitals:   10/09/20 2044 10/10/20 0647  BP: 105/65 110/62  Pulse: 82 72  Resp: 18 18  Temp: 98.2 F (36.8 C) 97.6 F (36.4 C)  SpO2: 97% 97%    Neurovascular intact Incision: dressing C/D/I  LABS Recent Labs    10/09/20 0346 10/10/20 0326  HGB 10.1* 9.9*  HCT 32.7* 30.7*  WBC 8.8 9.2  PLT 224 245    Recent Labs    10/09/20 0346  NA 135  K 3.8  BUN 16  CREATININE 0.46  GLUCOSE 190*    No results for input(s): LABPT, INR in the last 72 hours.   Assessment/Plan: 3 Days Post-Op Procedure(s) (LRB): OPEN REDUCTION INTERNAL (ORIF) FIXATION PATELLA (Right)   Advance diet Up with therapy to assess for safety for discharge today. Will order Toradol to help with her current pain level Will order portable Xrays of her right knee to make sure her patella fixation remains intact (AP and Lateral of her right knee revealed no displacement of her fracture after fall) If home today we will provide all DMEs needed RTC in 2 weeks HHPT for 2 weeks to assess safety in the home She is asked to rely on friends to help her get through this period of time

## 2020-10-14 DIAGNOSIS — Z9181 History of falling: Secondary | ICD-10-CM | POA: Diagnosis not present

## 2020-10-14 DIAGNOSIS — M80061D Age-related osteoporosis with current pathological fracture, right lower leg, subsequent encounter for fracture with routine healing: Secondary | ICD-10-CM | POA: Diagnosis not present

## 2020-10-14 DIAGNOSIS — F4321 Adjustment disorder with depressed mood: Secondary | ICD-10-CM | POA: Diagnosis not present

## 2020-10-14 DIAGNOSIS — I493 Ventricular premature depolarization: Secondary | ICD-10-CM | POA: Diagnosis not present

## 2020-10-14 DIAGNOSIS — K222 Esophageal obstruction: Secondary | ICD-10-CM | POA: Diagnosis not present

## 2020-10-14 DIAGNOSIS — Z7982 Long term (current) use of aspirin: Secondary | ICD-10-CM | POA: Diagnosis not present

## 2020-10-14 DIAGNOSIS — E7800 Pure hypercholesterolemia, unspecified: Secondary | ICD-10-CM | POA: Diagnosis not present

## 2020-10-14 DIAGNOSIS — E041 Nontoxic single thyroid nodule: Secondary | ICD-10-CM | POA: Diagnosis not present

## 2020-10-14 DIAGNOSIS — D649 Anemia, unspecified: Secondary | ICD-10-CM | POA: Diagnosis not present

## 2020-10-23 DIAGNOSIS — Z4789 Encounter for other orthopedic aftercare: Secondary | ICD-10-CM | POA: Diagnosis not present

## 2020-10-23 NOTE — Discharge Summary (Signed)
Physician Discharge Summary   Patient ID: Kelly Hahn MRN: [id number] DOB/AGE: June 28, 1950 70 y.o.  Admit date: 10/06/2020 Discharge date: 10/10/2020  Primary Diagnosis:  Closed displaced comminuted transverse right patellar fracture.  Admission Diagnoses:  Past Medical History:  Diagnosis Date  . Anemia   . Endometriosis   . Fall   . Fracture of arm   . Heart murmur   . High cholesterol   . Osteoporosis 07/2017   T score -3.0  . Palpitations   . Schatzki's ring   . Thyroid disease    Nodules   Discharge Diagnoses:   Active Problems:   Patella fracture   Closed displaced fracture of right patella, unspecified fracture morphology, initial encounter  Estimated body mass index is 20.94 kg/m as calculated from the following:   Height as of 04/22/20: 5\' 4"  (1.626 m).   Weight as of 04/22/20: 55.3 kg.  Procedure:  Procedure(s) (LRB): OPEN REDUCTION INTERNAL (ORIF) FIXATION PATELLA (Right)   Consults: None  HPI: The patient is a pleasant 70 year old female known to me from a prior left comminuted patellar fracture.  She underwent an open reduction and internal fixation for this about 5 years ago and successfully made through physical therapy.  She  unfortunately this past weekend was at [facility] for commencement exercises when she slipped and fell onto her right knee.  She had immediate onset of pain and inability to bear weight.  She was taken to the emergency room where radiographs  revealed a displaced patellar fracture.  Per her request, she was transferred to [facility] to be under our care.  We reviewed the indications for the procedure.  We discussed the postoperative course.  We discussed the risks of nonunion,  malunion, need for future surgeries in addition to infection and DVT.  Consent was obtained for management of the patellar fracture.  Laboratory Data: Admission on 10/06/2020, Discharged on 10/10/2020  Component Date Value Ref Range  Status  . SARS Coronavirus 2 by RT PCR 10/06/2020 NEGATIVE  NEGATIVE Final   Comment: (NOTE) SARS-CoV-2 target nucleic acids are NOT DETECTED.  The SARS-CoV-2 RNA is generally detectable in upper respiratory specimens during the acute phase of infection. The lowest concentration of SARS-CoV-2 viral copies this assay can detect is 138 copies/mL. A negative result does not preclude SARS-Cov-2 infection and should not be used as the sole basis for treatment or other patient management decisions. A negative result may occur with  improper specimen collection/handling, submission of specimen other than nasopharyngeal swab, presence of viral mutation(s) within the areas targeted by this assay, and inadequate number of viral copies(<138 copies/mL). A negative result must be combined with clinical observations, patient history, and epidemiological information. The expected result is Negative.  Fact Sheet for Patients:  EntrepreneurPulse.com.au  Fact Sheet for Healthcare Providers:  IncredibleEmployment.be  This test is no                          t yet approved or cleared by the [place] FDA and  has been authorized for detection and/or diagnosis of SARS-CoV-2 by FDA under an Emergency Use Authorization (EUA). This EUA will remain  in effect (meaning this test can be used) for the duration of the COVID-19 declaration under Section 564(b)(1) of the Act, 21 U.S.C.section 360bbb-3(b)(1), unless the authorization is terminated  or revoked sooner.      . Influenza A by PCR 10/06/2020 NEGATIVE  NEGATIVE Final  . Influenza  B by PCR 10/06/2020 NEGATIVE  NEGATIVE Final   Comment: (NOTE) The Xpert Xpress SARS-CoV-2/FLU/RSV plus assay is intended as an aid in the diagnosis of influenza from Nasopharyngeal swab specimens and should not be used as a sole basis for treatment. Nasal washings and aspirates are unacceptable for Xpert Xpress  SARS-CoV-2/FLU/RSV testing.  Fact Sheet for Patients: EntrepreneurPulse.com.au  Fact Sheet for Healthcare Providers: IncredibleEmployment.be  This test is not yet approved or cleared by the [place] FDA and has been authorized for detection and/or diagnosis of SARS-CoV-2 by FDA under an Emergency Use Authorization (EUA). This EUA will remain in effect (meaning this test can be used) for the duration of the COVID-19 declaration under Section 564(b)(1) of the Act, 21 U.S.C. section 360bbb-3(b)(1), unless the authorization is terminated or revoked.  Performed at [facility], [place] [address], [place], [place] [address]   . WBC 10/06/2020 9.8  4.0 - 10.5 K/uL Final  . RBC 10/06/2020 4.64  3.87 - 5.11 MIL/uL Final  . Hemoglobin 10/06/2020 12.4  12.0 - 15.0 g/dL Final  . HCT 10/06/2020 39.6  36.0 - 46.0 % Final  . MCV 10/06/2020 85.3  80.0 - 100.0 fL Final  . MCH 10/06/2020 26.7  26.0 - 34.0 pg Final  . MCHC 10/06/2020 31.3  30.0 - 36.0 g/dL Final  . RDW 10/06/2020 14.4  11.5 - 15.5 % Final  . Platelets 10/06/2020 296  150 - 400 K/uL Final  . nRBC 10/06/2020 0.0  0.0 - 0.2 % Final   Performed at [facility], [place] [address] [place], [place] [address]  . Creatinine, Ser 10/06/2020 0.69  0.44 - 1.00 mg/dL Final  . GFR, Estimated 10/06/2020 >60  >60 mL/min Final   Comment: (NOTE) Calculated using the CKD-EPI Creatinine Equation (2021) Performed at [facility], [place] [address] [place], [place] [address]   . MRSA, PCR 10/07/2020 NEGATIVE  NEGATIVE Final  . Staphylococcus aureus 10/07/2020 POSITIVE* NEGATIVE Final   Comment: (NOTE) The Xpert SA Assay (FDA approved for NASAL specimens in patients 56 years of age and older), is one component of a comprehensive surveillance program. It is not intended to diagnose infection nor to guide or monitor treatment. Performed at [facility],  [place] [address], [place], [place] [address]   . WBC 10/09/2020 8.8  4.0 - 10.5 K/uL Final  . RBC 10/09/2020 3.75* 3.87 - 5.11 MIL/uL Final  . Hemoglobin 10/09/2020 10.1* 12.0 - 15.0 g/dL Final  . HCT 10/09/2020 32.7* 36.0 - 46.0 % Final  . MCV 10/09/2020 87.2  80.0 - 100.0 fL Final  . MCH 10/09/2020 26.9  26.0 - 34.0 pg Final  . MCHC 10/09/2020 30.9  30.0 - 36.0 g/dL Final  . RDW 10/09/2020 14.1  11.5 - 15.5 % Final  . Platelets 10/09/2020 224  150 - 400 K/uL Final  . nRBC 10/09/2020 0.0  0.0 - 0.2 % Final   Performed at [facility], [place] [address], [place], [place] [address]  . Sodium 10/09/2020 135  135 - 145 mmol/L Final  . Potassium 10/09/2020 3.8  3.5 - 5.1 mmol/L Final  . Chloride 10/09/2020 103  98 - 111 mmol/L Final  . CO2 10/09/2020 23  22 - 32 mmol/L Final  . Glucose, Bld 10/09/2020 190* 70 - 99 mg/dL Final   Glucose reference range applies only to samples taken after fasting for at least 8 hours.  . BUN 10/09/2020 16  8 - 23  mg/dL Final  . Creatinine, Ser 10/09/2020 0.46  0.44 - 1.00 mg/dL Final  . Calcium 10/09/2020 8.5* 8.9 - 10.3 mg/dL Final  . GFR, Estimated 10/09/2020 >60  >60 mL/min Final   Comment: (NOTE) Calculated using the CKD-EPI Creatinine Equation (2021)   . Anion gap 10/09/2020 9  5 - 15 Final   Performed at [facility], [place] [address], [place], [place] [address]  . WBC 10/10/2020 9.2  4.0 - 10.5 K/uL Final  . RBC 10/10/2020 3.64* 3.87 - 5.11 MIL/uL Final  . Hemoglobin 10/10/2020 9.9* 12.0 - 15.0 g/dL Final  . HCT 10/10/2020 30.7* 36.0 - 46.0 % Final  . MCV 10/10/2020 84.3  80.0 - 100.0 fL Final  . MCH 10/10/2020 27.2  26.0 - 34.0 pg Final  . MCHC 10/10/2020 32.2  30.0 - 36.0 g/dL Final  . RDW 10/10/2020 14.3  11.5 - 15.5 % Final  . Platelets 10/10/2020 245  150 - 400 K/uL Final  . nRBC 10/10/2020 0.0  0.0 - 0.2 % Final   Performed at [facility], [place] [address], [place], [place] [address]      X-Rays:DG Knee 1-2 Views Right  Result Date: 10/08/2020 CLINICAL DATA:  Intra op ORIF right patella. EXAM: RIGHT KNEE - 1-2 VIEW; DG C-ARM 1-60 MIN-NO REPORT COMPARISON:  Preoperative radiograph 10/05/2020 FINDINGS: Five fluoroscopic spot views of the right knee obtained in the operating room. Placement of 2 cannulated screws through mid patellar fracture. Fluoroscopy time 38 seconds. Dose 2.1529 mGy. IMPRESSION: Intraoperative fluoroscopy during ORIF of right patellar fracture. Electronically Signed   By: Keith Rake M.D.   On: 10/08/2020 18:48   DG Knee Right Port  Result Date: 10/10/2020 CLINICAL DATA:  The patient is status post ORIF of the right patella two days ago. Patient fell in room yesterday. Imaging performed in a knee brace. EXAM: PORTABLE RIGHT KNEE - 1-2 VIEW COMPARISON:  Oct 05, 2020 and Oct 08, 2020 FINDINGS: Two screws traverse the patient's known patellar fracture. Alignment of the patella is not significantly changed compared to Oct 08, 2020. Soft tissue swelling is identified. A joint effusion is noted. Postoperative gas is seen in the soft tissues. No acute fractures otherwise seen.  The patient has a knee brace. IMPRESSION: The patient is status post ORIF of a patellar fracture. Hardware remains in stable and good position. There is no significant change in alignment at the fracture line. Postoperative soft tissue air is identified. Soft tissue swelling and a joint effusion are identified which could also be postoperative. Electronically Signed   By: Dorise Bullion III M.D   On: 10/10/2020 10:23   DG C-Arm 1-60 Min-No Report  Result Date: 10/08/2020 CLINICAL DATA:  Intra op ORIF right patella. EXAM: RIGHT KNEE - 1-2 VIEW; DG C-ARM 1-60 MIN-NO REPORT COMPARISON:  Preoperative radiograph 10/05/2020 FINDINGS: Five fluoroscopic spot views of the right knee obtained in the operating room. Placement of 2 cannulated screws through mid patellar fracture. Fluoroscopy time 38 seconds.  Dose 2.1529 mGy. IMPRESSION: Intraoperative fluoroscopy during ORIF of right patellar fracture. Electronically Signed   By: Keith Rake M.D.   On: 10/08/2020 18:48   DG Outside Films Extremity  Result Date: 10/06/2020 This examination belongs to an outside facility and is stored here for comparison purposes only.  Contact the originating outside institution for any associated report or interpretation.   EKG: Orders placed or performed during the hospital encounter of 03/03/16  . EKG 12-Lead  . EKG  12-Lead     Hospital Course: Kelly Hahn is a 70 y.o. who was admitted to [facility]. They were brought to the operating room on 10/08/2020 and underwent Procedure(s): OPEN REDUCTION INTERNAL (ORIF) FIXATION PATELLA.  Patient tolerated the procedure well and was later transferred to the recovery room and then to the orthopaedic floor for postoperative care. They were given PO and IV analgesics for pain control following their surgery. They were given 24 hours of postoperative antibiotics of  Anti-infectives (From admission, onward)   Start     Dose/Rate Route Frequency Ordered Stop   10/08/20 2200  ceFAZolin (ANCEF) IVPB 2g/100 mL premix        2 g 200 mL/hr over 30 Minutes Intravenous Every 6 hours 10/08/20 1859 10/09/20 0358   10/08/20 0915  ceFAZolin (ANCEF) IVPB 2g/100 mL premix        2 g 200 mL/hr over 30 Minutes Intravenous On call to O.R. 10/08/20 3419 10/08/20 1605     and started on DVT prophylaxis in the form of Aspirin.   PT and OT were ordered for mobility and ambulation training. Discharge planning consulted to help with postop disposition and equipment needs.  Patient had a good night on the evening of surgery. They started to get up OOB with therapy on POD #0. She continued working with PT on POD #1. Pt was seen during rounds on POD #2 and was ready to go home pending progress with therapy.She worked with therapy on POD #1 and was meeting her goals. Pt was  discharged to home later that day in stable condition.  Diet: Regular diet Activity: WBAT Follow-up: in 2 weeks Disposition: Home Discharged Condition: good   Discharge Instructions    Call MD / Call 911   Complete by: As directed    If you experience chest pain or shortness of breath, CALL 911 and be transported to the hospital emergency room.  If you develope a fever above 101 F, pus (white drainage) or increased drainage or redness at the wound, or calf pain, call your surgeon's office.   Change dressing   Complete by: As directed    Maintain surgical dressing until follow up in the clinic. If the edges start to pull up, may reinforce with tape. If the dressing is no longer working, may remove and cover with gauze and tape, but must keep the area dry and clean.  Call with any questions or concerns.   Constipation Prevention   Complete by: As directed    Drink plenty of fluids.  Prune juice may be helpful.  You may use a stool softener, such as Colace (over the counter) 100 mg twice a day.  Use MiraLax (over the counter) for constipation as needed.   Diet - low sodium heart healthy   Complete by: As directed    Increase activity slowly as tolerated   Complete by: As directed    Weight bearing as tolerated with assist device (walker, cane, etc) as directed, use it as long as suggested by your surgeon or therapist, typically at least 4-6 weeks.  **REMAIN IN BRACE, LOCKED IN FULL EXTENSION**   Post-operative opioid taper instructions:   Complete by: As directed    POST-OPERATIVE OPIOID TAPER INSTRUCTIONS: It is important to wean off of your opioid medication as soon as possible. If you do not need pain medication after your surgery it is ok to stop day one. Opioids include: Codeine, Hydrocodone(Norco, Vicodin), Oxycodone(Percocet, oxycontin) and hydromorphone amongst others.  Long term and even short term use of opiods can cause: Increased pain  response Dependence Constipation Depression Respiratory depression And more.  Withdrawal symptoms can include Flu like symptoms Nausea, vomiting And more Techniques to manage these symptoms Hydrate well Eat regular healthy meals Stay active Use relaxation techniques(deep breathing, meditating, yoga) Do Not substitute Alcohol to help with tapering If you have been on opioids for less than two weeks and do not have pain than it is ok to stop all together.  Plan to wean off of opioids This plan should start within one week post op of your joint replacement. Maintain the same interval or time between taking each dose and first decrease the dose.  Cut the total daily intake of opioids by one tablet each day Next start to increase the time between doses. The last dose that should be eliminated is the evening dose.      TED hose   Complete by: As directed    Use stockings (TED hose) for 2 weeks on both leg(s).  You may remove them at night for sleeping.     Allergies as of 10/10/2020      Reactions   Hydrocodone Other (See Comments)   Makes her feel like she's not breathing   Tramadol Other (See Comments)   "head pounding, made me loopy, delusional, too strong for me"      Medication List    TAKE these medications   aspirin 81 MG chewable tablet Chew 1 tablet (81 mg total) by mouth 2 (two) times daily for 28 days.   B COMPLEX PO Take 1 tablet by mouth daily.   Biotin 5000 MCG Caps Take 5,000 mcg by mouth 2 (two) times daily.   cholecalciferol 1000 units tablet Commonly known as: VITAMIN D Take 1,000 Units by mouth daily.   docusate sodium 100 MG capsule Commonly known as: COLACE Take 1 capsule (100 mg total) by mouth 2 (two) times daily.   Evening Primrose Oil 1000 MG Caps Take 1,300 mg by mouth 2 (two) times daily.   methocarbamol 500 MG tablet Commonly known as: ROBAXIN Take 1 tablet (500 mg total) by mouth every 6 (six) hours as needed for muscle spasms.    OVER THE COUNTER MEDICATION Take 1 tablet by mouth daily. Nutriferon  Immune system Supplement   OVER THE COUNTER MEDICATION AlgeaCal plus--Stromtrin boost   OVER THE COUNTER MEDICATION Take 1 tablet by mouth daily. shaklee multivitamin --strip of seven pills that you take once a day.   oxyCODONE 5 MG immediate release tablet Commonly known as: Oxy IR/ROXICODONE Take 1-2 tablets (5-10 mg total) by mouth every 6 (six) hours as needed for moderate pain or severe pain.   polyethylene glycol 17 g packet Commonly known as: MIRALAX / GLYCOLAX Take 17 g by mouth daily as needed for mild constipation.   VITAMIN B12-FOLIC ACID PO Take 1 tablet by mouth daily.            Discharge Care Instructions  (From admission, onward)         Start     Ordered   10/10/20 0000  Change dressing       Comments: Maintain surgical dressing until follow up in the clinic. If the edges start to pull up, may reinforce with tape. If the dressing is no longer working, may remove and cover with gauze and tape, but must keep the area dry and clean.  Call with any questions or concerns.   10/10/20 1154  Follow-up Information    Paralee Cancel, MD. Schedule an appointment as soon as possible for a visit in 2 weeks.   Specialty: Orthopedic Surgery Contact information: [address] [place] [address] [phone]        [facility] [place] Follow up.   Specialty: Home Health Services Why: PT Contact information: [address] [place] [address] [phone]               Signed: Griffith Citron, PA-C Orthopedic Surgery 10/23/2020, 7:41 AM

## 2020-10-24 DIAGNOSIS — I493 Ventricular premature depolarization: Secondary | ICD-10-CM | POA: Diagnosis not present

## 2020-10-24 DIAGNOSIS — E041 Nontoxic single thyroid nodule: Secondary | ICD-10-CM | POA: Diagnosis not present

## 2020-10-24 DIAGNOSIS — M80061D Age-related osteoporosis with current pathological fracture, right lower leg, subsequent encounter for fracture with routine healing: Secondary | ICD-10-CM | POA: Diagnosis not present

## 2020-10-24 DIAGNOSIS — D649 Anemia, unspecified: Secondary | ICD-10-CM | POA: Diagnosis not present

## 2020-10-24 DIAGNOSIS — K222 Esophageal obstruction: Secondary | ICD-10-CM | POA: Diagnosis not present

## 2020-10-24 DIAGNOSIS — E7800 Pure hypercholesterolemia, unspecified: Secondary | ICD-10-CM | POA: Diagnosis not present

## 2020-10-29 DIAGNOSIS — K222 Esophageal obstruction: Secondary | ICD-10-CM | POA: Diagnosis not present

## 2020-10-29 DIAGNOSIS — E041 Nontoxic single thyroid nodule: Secondary | ICD-10-CM | POA: Diagnosis not present

## 2020-10-29 DIAGNOSIS — M80061D Age-related osteoporosis with current pathological fracture, right lower leg, subsequent encounter for fracture with routine healing: Secondary | ICD-10-CM | POA: Diagnosis not present

## 2020-10-29 DIAGNOSIS — E7800 Pure hypercholesterolemia, unspecified: Secondary | ICD-10-CM | POA: Diagnosis not present

## 2020-10-29 DIAGNOSIS — I493 Ventricular premature depolarization: Secondary | ICD-10-CM | POA: Diagnosis not present

## 2020-10-29 DIAGNOSIS — D649 Anemia, unspecified: Secondary | ICD-10-CM | POA: Diagnosis not present

## 2020-11-04 DIAGNOSIS — M80061D Age-related osteoporosis with current pathological fracture, right lower leg, subsequent encounter for fracture with routine healing: Secondary | ICD-10-CM | POA: Diagnosis not present

## 2020-11-04 DIAGNOSIS — D649 Anemia, unspecified: Secondary | ICD-10-CM | POA: Diagnosis not present

## 2020-11-04 DIAGNOSIS — K222 Esophageal obstruction: Secondary | ICD-10-CM | POA: Diagnosis not present

## 2020-11-04 DIAGNOSIS — E7800 Pure hypercholesterolemia, unspecified: Secondary | ICD-10-CM | POA: Diagnosis not present

## 2020-11-04 DIAGNOSIS — E041 Nontoxic single thyroid nodule: Secondary | ICD-10-CM | POA: Diagnosis not present

## 2020-11-04 DIAGNOSIS — I493 Ventricular premature depolarization: Secondary | ICD-10-CM | POA: Diagnosis not present

## 2020-11-12 DIAGNOSIS — E041 Nontoxic single thyroid nodule: Secondary | ICD-10-CM | POA: Diagnosis not present

## 2020-11-12 DIAGNOSIS — M80061D Age-related osteoporosis with current pathological fracture, right lower leg, subsequent encounter for fracture with routine healing: Secondary | ICD-10-CM | POA: Diagnosis not present

## 2020-11-12 DIAGNOSIS — K222 Esophageal obstruction: Secondary | ICD-10-CM | POA: Diagnosis not present

## 2020-11-12 DIAGNOSIS — E7800 Pure hypercholesterolemia, unspecified: Secondary | ICD-10-CM | POA: Diagnosis not present

## 2020-11-12 DIAGNOSIS — D649 Anemia, unspecified: Secondary | ICD-10-CM | POA: Diagnosis not present

## 2020-11-12 DIAGNOSIS — I493 Ventricular premature depolarization: Secondary | ICD-10-CM | POA: Diagnosis not present

## 2020-11-13 DIAGNOSIS — M25561 Pain in right knee: Secondary | ICD-10-CM | POA: Diagnosis not present

## 2020-11-13 DIAGNOSIS — M25661 Stiffness of right knee, not elsewhere classified: Secondary | ICD-10-CM | POA: Diagnosis not present

## 2020-11-15 DIAGNOSIS — M25661 Stiffness of right knee, not elsewhere classified: Secondary | ICD-10-CM | POA: Diagnosis not present

## 2020-11-15 DIAGNOSIS — M25561 Pain in right knee: Secondary | ICD-10-CM | POA: Diagnosis not present

## 2020-11-20 DIAGNOSIS — M25661 Stiffness of right knee, not elsewhere classified: Secondary | ICD-10-CM | POA: Diagnosis not present

## 2020-11-20 DIAGNOSIS — M25561 Pain in right knee: Secondary | ICD-10-CM | POA: Diagnosis not present

## 2020-11-22 DIAGNOSIS — M25561 Pain in right knee: Secondary | ICD-10-CM | POA: Diagnosis not present

## 2020-11-22 DIAGNOSIS — M25661 Stiffness of right knee, not elsewhere classified: Secondary | ICD-10-CM | POA: Diagnosis not present

## 2020-11-26 DIAGNOSIS — M25561 Pain in right knee: Secondary | ICD-10-CM | POA: Diagnosis not present

## 2020-11-26 DIAGNOSIS — M25661 Stiffness of right knee, not elsewhere classified: Secondary | ICD-10-CM | POA: Diagnosis not present

## 2020-11-27 DIAGNOSIS — M25561 Pain in right knee: Secondary | ICD-10-CM | POA: Diagnosis not present

## 2020-11-27 DIAGNOSIS — Z4789 Encounter for other orthopedic aftercare: Secondary | ICD-10-CM | POA: Diagnosis not present

## 2020-11-29 DIAGNOSIS — M25561 Pain in right knee: Secondary | ICD-10-CM | POA: Diagnosis not present

## 2020-11-29 DIAGNOSIS — M25661 Stiffness of right knee, not elsewhere classified: Secondary | ICD-10-CM | POA: Diagnosis not present

## 2020-12-03 DIAGNOSIS — M25661 Stiffness of right knee, not elsewhere classified: Secondary | ICD-10-CM | POA: Diagnosis not present

## 2020-12-03 DIAGNOSIS — M25561 Pain in right knee: Secondary | ICD-10-CM | POA: Diagnosis not present

## 2020-12-06 DIAGNOSIS — M25561 Pain in right knee: Secondary | ICD-10-CM | POA: Diagnosis not present

## 2020-12-06 DIAGNOSIS — M25661 Stiffness of right knee, not elsewhere classified: Secondary | ICD-10-CM | POA: Diagnosis not present

## 2020-12-10 DIAGNOSIS — M25561 Pain in right knee: Secondary | ICD-10-CM | POA: Diagnosis not present

## 2020-12-10 DIAGNOSIS — M25661 Stiffness of right knee, not elsewhere classified: Secondary | ICD-10-CM | POA: Diagnosis not present

## 2020-12-13 DIAGNOSIS — M25561 Pain in right knee: Secondary | ICD-10-CM | POA: Diagnosis not present

## 2020-12-13 DIAGNOSIS — M25661 Stiffness of right knee, not elsewhere classified: Secondary | ICD-10-CM | POA: Diagnosis not present

## 2020-12-17 DIAGNOSIS — M25661 Stiffness of right knee, not elsewhere classified: Secondary | ICD-10-CM | POA: Diagnosis not present

## 2020-12-17 DIAGNOSIS — M810 Age-related osteoporosis without current pathological fracture: Secondary | ICD-10-CM | POA: Diagnosis not present

## 2020-12-17 DIAGNOSIS — R82998 Other abnormal findings in urine: Secondary | ICD-10-CM | POA: Diagnosis not present

## 2020-12-17 DIAGNOSIS — E785 Hyperlipidemia, unspecified: Secondary | ICD-10-CM | POA: Diagnosis not present

## 2020-12-17 DIAGNOSIS — M25561 Pain in right knee: Secondary | ICD-10-CM | POA: Diagnosis not present

## 2020-12-17 DIAGNOSIS — E049 Nontoxic goiter, unspecified: Secondary | ICD-10-CM | POA: Diagnosis not present

## 2020-12-20 DIAGNOSIS — M25661 Stiffness of right knee, not elsewhere classified: Secondary | ICD-10-CM | POA: Diagnosis not present

## 2020-12-20 DIAGNOSIS — M25561 Pain in right knee: Secondary | ICD-10-CM | POA: Diagnosis not present

## 2020-12-24 DIAGNOSIS — R809 Proteinuria, unspecified: Secondary | ICD-10-CM | POA: Diagnosis not present

## 2020-12-24 DIAGNOSIS — M85852 Other specified disorders of bone density and structure, left thigh: Secondary | ICD-10-CM | POA: Diagnosis not present

## 2020-12-24 DIAGNOSIS — Z1339 Encounter for screening examination for other mental health and behavioral disorders: Secondary | ICD-10-CM | POA: Diagnosis not present

## 2020-12-24 DIAGNOSIS — M25661 Stiffness of right knee, not elsewhere classified: Secondary | ICD-10-CM | POA: Diagnosis not present

## 2020-12-24 DIAGNOSIS — Z0000 Encounter for general adult medical examination without abnormal findings: Secondary | ICD-10-CM | POA: Diagnosis not present

## 2020-12-24 DIAGNOSIS — E785 Hyperlipidemia, unspecified: Secondary | ICD-10-CM | POA: Diagnosis not present

## 2020-12-24 DIAGNOSIS — M85851 Other specified disorders of bone density and structure, right thigh: Secondary | ICD-10-CM | POA: Diagnosis not present

## 2020-12-24 DIAGNOSIS — M25561 Pain in right knee: Secondary | ICD-10-CM | POA: Diagnosis not present

## 2020-12-24 DIAGNOSIS — E049 Nontoxic goiter, unspecified: Secondary | ICD-10-CM | POA: Diagnosis not present

## 2020-12-24 DIAGNOSIS — Z1331 Encounter for screening for depression: Secondary | ICD-10-CM | POA: Diagnosis not present

## 2020-12-27 DIAGNOSIS — M25661 Stiffness of right knee, not elsewhere classified: Secondary | ICD-10-CM | POA: Diagnosis not present

## 2020-12-27 DIAGNOSIS — M25561 Pain in right knee: Secondary | ICD-10-CM | POA: Diagnosis not present

## 2021-01-01 DIAGNOSIS — M25561 Pain in right knee: Secondary | ICD-10-CM | POA: Diagnosis not present

## 2021-01-01 DIAGNOSIS — Z4789 Encounter for other orthopedic aftercare: Secondary | ICD-10-CM | POA: Diagnosis not present

## 2021-01-02 DIAGNOSIS — Z1231 Encounter for screening mammogram for malignant neoplasm of breast: Secondary | ICD-10-CM | POA: Diagnosis not present

## 2021-01-05 DIAGNOSIS — H0015 Chalazion left lower eyelid: Secondary | ICD-10-CM | POA: Diagnosis not present

## 2021-01-05 DIAGNOSIS — H01005 Unspecified blepharitis left lower eyelid: Secondary | ICD-10-CM | POA: Diagnosis not present

## 2021-01-05 DIAGNOSIS — H05222 Edema of left orbit: Secondary | ICD-10-CM | POA: Diagnosis not present

## 2021-01-05 DIAGNOSIS — L03213 Periorbital cellulitis: Secondary | ICD-10-CM | POA: Diagnosis not present

## 2021-01-07 DIAGNOSIS — M25561 Pain in right knee: Secondary | ICD-10-CM | POA: Diagnosis not present

## 2021-01-07 DIAGNOSIS — M25661 Stiffness of right knee, not elsewhere classified: Secondary | ICD-10-CM | POA: Diagnosis not present

## 2021-01-10 DIAGNOSIS — M25561 Pain in right knee: Secondary | ICD-10-CM | POA: Diagnosis not present

## 2021-01-10 DIAGNOSIS — M25661 Stiffness of right knee, not elsewhere classified: Secondary | ICD-10-CM | POA: Diagnosis not present

## 2021-01-14 DIAGNOSIS — M25561 Pain in right knee: Secondary | ICD-10-CM | POA: Diagnosis not present

## 2021-01-14 DIAGNOSIS — M25661 Stiffness of right knee, not elsewhere classified: Secondary | ICD-10-CM | POA: Diagnosis not present

## 2021-01-17 DIAGNOSIS — M25561 Pain in right knee: Secondary | ICD-10-CM | POA: Diagnosis not present

## 2021-01-17 DIAGNOSIS — M25661 Stiffness of right knee, not elsewhere classified: Secondary | ICD-10-CM | POA: Diagnosis not present

## 2021-01-21 DIAGNOSIS — M25661 Stiffness of right knee, not elsewhere classified: Secondary | ICD-10-CM | POA: Diagnosis not present

## 2021-01-21 DIAGNOSIS — M25561 Pain in right knee: Secondary | ICD-10-CM | POA: Diagnosis not present

## 2021-01-23 DIAGNOSIS — R809 Proteinuria, unspecified: Secondary | ICD-10-CM | POA: Diagnosis not present

## 2021-01-24 DIAGNOSIS — M25561 Pain in right knee: Secondary | ICD-10-CM | POA: Diagnosis not present

## 2021-01-24 DIAGNOSIS — M25661 Stiffness of right knee, not elsewhere classified: Secondary | ICD-10-CM | POA: Diagnosis not present

## 2021-04-28 DIAGNOSIS — D485 Neoplasm of uncertain behavior of skin: Secondary | ICD-10-CM | POA: Diagnosis not present

## 2021-04-28 DIAGNOSIS — L820 Inflamed seborrheic keratosis: Secondary | ICD-10-CM | POA: Diagnosis not present

## 2021-04-28 DIAGNOSIS — L570 Actinic keratosis: Secondary | ICD-10-CM | POA: Diagnosis not present

## 2021-04-29 ENCOUNTER — Other Ambulatory Visit: Payer: Self-pay

## 2021-04-29 ENCOUNTER — Encounter: Payer: Self-pay | Admitting: Obstetrics & Gynecology

## 2021-04-29 ENCOUNTER — Ambulatory Visit (INDEPENDENT_AMBULATORY_CARE_PROVIDER_SITE_OTHER): Payer: Medicare Other | Admitting: Obstetrics & Gynecology

## 2021-04-29 VITALS — BP 104/68 | HR 76 | Resp 16 | Ht 63.75 in | Wt 126.0 lb

## 2021-04-29 DIAGNOSIS — B009 Herpesviral infection, unspecified: Secondary | ICD-10-CM

## 2021-04-29 DIAGNOSIS — M810 Age-related osteoporosis without current pathological fracture: Secondary | ICD-10-CM

## 2021-04-29 DIAGNOSIS — Z9289 Personal history of other medical treatment: Secondary | ICD-10-CM

## 2021-04-29 DIAGNOSIS — Z9189 Other specified personal risk factors, not elsewhere classified: Secondary | ICD-10-CM

## 2021-04-29 DIAGNOSIS — Z780 Asymptomatic menopausal state: Secondary | ICD-10-CM

## 2021-04-29 DIAGNOSIS — Z01419 Encounter for gynecological examination (general) (routine) without abnormal findings: Secondary | ICD-10-CM

## 2021-04-29 NOTE — Progress Notes (Signed)
JAHNAI SLINGERLAND [birth year] [id number]   History:    70 y.o. G1P1L1  Married.  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  Patient is his caregiver.   RP:  Established patient presenting for annual gyn exam    HPI: Postmenopause, well on no HRT.  No PMB.  Mild Postmenopausal atrophic changes.  No pelvic pain.  Currently abstinent.  Osteoporosis, improved to Osteopenia on last BD 06/21/2020, improved at all sites on Bone supplement and Bone strong Gym.  Breasts normal.  Mammography 07/2020, requested fax from [place]. Pap smear Neg 01/07/2017.  No history of abnormal Pap smears. BMI 21.8.  Good fitness and healthy nutrition. COLONOSCOPY: 02-08-18. Health labs with Fam MD.  Past medical history,surgical history, family history and social history were all reviewed and documented in the EPIC chart.  Gynecologic History No LMP recorded. Patient is postmenopausal.  Obstetric History OB History  Gravida Para Term Preterm AB Living  1 1 1         SAB IAB Ectopic Multiple Live Births          1    # Outcome Date GA Lbr Len/2nd Weight Sex Delivery Anes PTL Lv  1 Term              ROS: A ROS was performed and pertinent positives and negatives are included in the history.  GENERAL: No fevers or chills. HEENT: No change in vision, no earache, sore throat or sinus congestion. NECK: No pain or stiffness. CARDIOVASCULAR: No chest pain or pressure. No palpitations. PULMONARY: No shortness of breath, cough or wheeze. GASTROINTESTINAL: No abdominal pain, nausea, vomiting or diarrhea, melena or bright red blood per rectum. GENITOURINARY: No urinary frequency, urgency, hesitancy or dysuria. MUSCULOSKELETAL: No joint or muscle pain, no back pain, no recent trauma. DERMATOLOGIC: No rash, no itching, no lesions. ENDOCRINE: No polyuria, polydipsia, no heat or cold intolerance. No recent change in weight. HEMATOLOGICAL: No anemia or easy bruising or bleeding. NEUROLOGIC: No headache, seizures, numbness,  tingling or weakness. PSYCHIATRIC: No depression, no loss of interest in normal activity or change in sleep pattern.     Exam:   BP 104/68   Pulse 76   Resp 16   Ht 5' 3.75" (1.619 m)   Wt 126 lb (57.2 kg)   BMI 21.80 kg/m   Body mass index is 21.8 kg/m.  General appearance : Well developed well nourished female. No acute distress HEENT: Eyes: no retinal hemorrhage or exudates,  Neck supple, trachea midline, no carotid bruits, no thyroidmegaly Lungs: Clear to auscultation, no rhonchi or wheezes, or rib retractions  Heart: Regular rate and rhythm, no murmurs or gallops Breast:Examined in sitting and supine position were symmetrical in appearance, no palpable masses or tenderness,  no skin retraction, no nipple inversion, no nipple discharge, no skin discoloration, no axillary or supraclavicular lymphadenopathy Abdomen: no palpable masses or tenderness, no rebound or guarding Extremities: no edema or skin discoloration or tenderness  Pelvic: Vulva: Normal             Vagina: No gross lesions or discharge  Cervix: No gross lesions or discharge  Uterus  AV, normal size, shape and consistency, non-tender and mobile  Adnexa  Without masses or tenderness  Anus: Normal   Assessment/Plan:  70 y.o. female for annual exam   1. Well female exam with routine gynecological exam Postmenopause, well on no HRT.  No PMB.  Mild Postmenopausal atrophic changes.  No pelvic pain.  Currently abstinent.  Osteoporosis, improved  to Osteopenia on last BD 06/21/2020, improved at all sites on Bone supplement and Bone strong Gym.  Breasts normal.  Mammography 07/2020, requested fax from [place]. Pap smear Neg 01/07/2017.  No history of abnormal Pap smears. BMI 21.8. Good fitness and healthy nutrition. COLONOSCOPY: 02-08-18. Health labs with Fam MD.  2. At risk of fracture due to osteoporosis  3. Postmenopause Postmenopause, well on no HRT.  No PMB.  Mild Postmenopausal atrophic changes.  No pelvic pain.   Currently abstinent.  4. Age-related osteoporosis without current pathological fracture  Osteoporosis, improved to Osteopenia on last BD 06/21/2020, improved at all sites on Bone supplement and Bone strong Gym.   5. Personal history of other medical treatment   Princess Bruins MD, 10:30 AM 04/29/2021

## 2021-06-23 DIAGNOSIS — R053 Chronic cough: Secondary | ICD-10-CM | POA: Diagnosis not present

## 2021-08-04 ENCOUNTER — Encounter: Payer: Self-pay | Admitting: Obstetrics & Gynecology

## 2021-09-01 DIAGNOSIS — S2232XA Fracture of one rib, left side, initial encounter for closed fracture: Secondary | ICD-10-CM | POA: Diagnosis not present

## 2021-09-01 DIAGNOSIS — R0781 Pleurodynia: Secondary | ICD-10-CM | POA: Diagnosis not present

## 2021-09-01 DIAGNOSIS — M85852 Other specified disorders of bone density and structure, left thigh: Secondary | ICD-10-CM | POA: Diagnosis not present

## 2021-09-01 DIAGNOSIS — M85851 Other specified disorders of bone density and structure, right thigh: Secondary | ICD-10-CM | POA: Diagnosis not present

## 2022-02-05 DIAGNOSIS — E785 Hyperlipidemia, unspecified: Secondary | ICD-10-CM | POA: Diagnosis not present

## 2022-02-05 DIAGNOSIS — M810 Age-related osteoporosis without current pathological fracture: Secondary | ICD-10-CM | POA: Diagnosis not present

## 2022-02-05 DIAGNOSIS — R7989 Other specified abnormal findings of blood chemistry: Secondary | ICD-10-CM | POA: Diagnosis not present

## 2022-02-05 DIAGNOSIS — E049 Nontoxic goiter, unspecified: Secondary | ICD-10-CM | POA: Diagnosis not present

## 2022-02-12 DIAGNOSIS — E049 Nontoxic goiter, unspecified: Secondary | ICD-10-CM | POA: Diagnosis not present

## 2022-02-12 DIAGNOSIS — F5101 Primary insomnia: Secondary | ICD-10-CM | POA: Diagnosis not present

## 2022-02-12 DIAGNOSIS — M810 Age-related osteoporosis without current pathological fracture: Secondary | ICD-10-CM | POA: Diagnosis not present

## 2022-02-12 DIAGNOSIS — F419 Anxiety disorder, unspecified: Secondary | ICD-10-CM | POA: Diagnosis not present

## 2022-02-12 DIAGNOSIS — E785 Hyperlipidemia, unspecified: Secondary | ICD-10-CM | POA: Diagnosis not present

## 2022-02-12 DIAGNOSIS — Z0000 Encounter for general adult medical examination without abnormal findings: Secondary | ICD-10-CM | POA: Diagnosis not present

## 2022-02-13 DIAGNOSIS — R82998 Other abnormal findings in urine: Secondary | ICD-10-CM | POA: Diagnosis not present

## 2022-02-24 DIAGNOSIS — Z0000 Encounter for general adult medical examination without abnormal findings: Secondary | ICD-10-CM | POA: Diagnosis not present

## 2022-03-20 DIAGNOSIS — H5319 Other subjective visual disturbances: Secondary | ICD-10-CM | POA: Diagnosis not present

## 2022-03-20 DIAGNOSIS — H2513 Age-related nuclear cataract, bilateral: Secondary | ICD-10-CM | POA: Diagnosis not present

## 2022-03-20 DIAGNOSIS — H43811 Vitreous degeneration, right eye: Secondary | ICD-10-CM | POA: Diagnosis not present

## 2022-03-20 DIAGNOSIS — H04123 Dry eye syndrome of bilateral lacrimal glands: Secondary | ICD-10-CM | POA: Diagnosis not present

## 2022-05-01 ENCOUNTER — Other Ambulatory Visit (HOSPITAL_COMMUNITY)
Admission: RE | Admit: 2022-05-01 | Discharge: 2022-05-01 | Disposition: A | Discharge status: Home / Self Care | Payer: Medicare Other | Source: Ambulatory Visit | Attending: Obstetrics & Gynecology | Admitting: Obstetrics & Gynecology

## 2022-05-01 ENCOUNTER — Ambulatory Visit (INDEPENDENT_AMBULATORY_CARE_PROVIDER_SITE_OTHER): Payer: Medicare Other | Admitting: Obstetrics & Gynecology

## 2022-05-01 ENCOUNTER — Encounter: Payer: Self-pay | Admitting: Obstetrics & Gynecology

## 2022-05-01 VITALS — BP 116/64 | HR 72 | Resp 16 | Ht 64.25 in | Wt 127.0 lb

## 2022-05-01 DIAGNOSIS — M8589 Other specified disorders of bone density and structure, multiple sites: Secondary | ICD-10-CM | POA: Diagnosis not present

## 2022-05-01 DIAGNOSIS — B009 Herpesviral infection, unspecified: Secondary | ICD-10-CM | POA: Diagnosis not present

## 2022-05-01 DIAGNOSIS — Z780 Asymptomatic menopausal state: Secondary | ICD-10-CM | POA: Diagnosis not present

## 2022-05-01 DIAGNOSIS — Z01419 Encounter for gynecological examination (general) (routine) without abnormal findings: Secondary | ICD-10-CM | POA: Diagnosis not present

## 2022-05-01 DIAGNOSIS — Z9189 Other specified personal risk factors, not elsewhere classified: Secondary | ICD-10-CM

## 2022-05-01 DIAGNOSIS — Z9289 Personal history of other medical treatment: Secondary | ICD-10-CM

## 2022-05-01 DIAGNOSIS — Z124 Encounter for screening for malignant neoplasm of cervix: Secondary | ICD-10-CM

## 2022-05-01 NOTE — Progress Notes (Signed)
Kelly Hahn 04/27/1951 [id number]   History:    71 y.o.  G1P1L1  Married.  Husband retired judge/[place]/Prostate Ca/Amyloidosis.  Patient is his caregiver.   RP:  Established patient presenting for annual gyn exam    HPI: Postmenopause, well on no HRT.  No PMB.  Mild Postmenopausal atrophic changes.  No pelvic pain.  Currently abstinent.  Last Pap Neg 12/2016. No h/o abnormal Pap.  Pap reflex today. Osteoporosis, improved to Osteopenia on last BD 06/21/2020, improved at all sites on Bone supplement and Bone strong Gym.  Breasts normal.  Mammography Neg 2023.  BMI 21.8.  Good fitness and healthy nutrition. COLONOSCOPY: 02-08-18. Health labs with Fam MD.  Flu vaccine-declined.  Past medical history,surgical history, family history and social history were all reviewed and documented in the EPIC chart.  Gynecologic History No LMP recorded. Patient is postmenopausal.  Obstetric History OB History  Gravida Para Term Preterm AB Living  '1 1 1        '$ SAB IAB Ectopic Multiple Live Births          1    # Outcome Date GA Lbr Len/2nd Weight Sex Delivery Anes PTL Lv  1 Term              ROS: A ROS was performed and pertinent positives and negatives are included in the history. GENERAL: No fevers or chills. HEENT: No change in vision, no earache, sore throat or sinus congestion. NECK: No pain or stiffness. CARDIOVASCULAR: No chest pain or pressure. No palpitations. PULMONARY: No shortness of breath, cough or wheeze. GASTROINTESTINAL: No abdominal pain, nausea, vomiting or diarrhea, melena or bright red blood per rectum. GENITOURINARY: No urinary frequency, urgency, hesitancy or dysuria. MUSCULOSKELETAL: No joint or muscle pain, no back pain, no recent trauma. DERMATOLOGIC: No rash, no itching, no lesions. ENDOCRINE: No polyuria, polydipsia, no heat or cold intolerance. No recent change in weight. HEMATOLOGICAL: No anemia or easy bruising or bleeding. NEUROLOGIC: No headache, seizures, numbness,  tingling or weakness. PSYCHIATRIC: No depression, no loss of interest in normal activity or change in sleep pattern.     Exam:   BP 116/64   Pulse 72   Resp 16   Ht 5' 4.25" (1.632 m)   Wt 127 lb (57.6 kg)   BMI 21.63 kg/m   Body mass index is 21.63 kg/m.  General appearance : Well developed well nourished female. No acute distress HEENT: Eyes: no retinal hemorrhage or exudates,  Neck supple, trachea midline, no carotid bruits, no thyroidmegaly Lungs: Clear to auscultation, no rhonchi or wheezes, or rib retractions  Heart: Regular rate and rhythm, no murmurs or gallops Breast:Examined in sitting and supine position were symmetrical in appearance, no palpable masses or tenderness,  no skin retraction, no nipple inversion, no nipple discharge, no skin discoloration, no axillary or supraclavicular lymphadenopathy Abdomen: no palpable masses or tenderness, no rebound or guarding Extremities: no edema or skin discoloration or tenderness  Pelvic: Vulva: Normal             Vagina: No gross lesions or discharge  Cervix: No gross lesions or discharge.  Pap reflex done.  Uterus  AV, normal size, shape and consistency, non-tender and mobile  Adnexa  Without masses or tenderness  Anus: Normal   Assessment/Plan:  71 y.o. female for annual exam   1. Encounter for routine gynecological examination with Papanicolaou smear of cervix Postmenopause, well on no HRT.  No PMB.  Mild Postmenopausal atrophic changes.  No pelvic pain.  Currently abstinent.  Last Pap Neg 12/2016. No h/o abnormal Pap.  Pap reflex today. Osteoporosis, improved to Osteopenia on last BD 06/21/2020, improved at all sites on Bone supplement and Bone strong Gym.  Breasts normal.  Mammography Neg 2023.  BMI 21.8.  Good fitness and healthy nutrition. COLONOSCOPY: 02-08-18. Health labs with Fam MD.  Flu vaccine-declined. - Cytology - PAP( [place])  2. Postmenopause Postmenopause, well on no HRT.  No PMB.  Mild Postmenopausal  atrophic changes.  No pelvic pain.  Currently abstinent.   3. Osteopenia of multiple sites Osteoporosis, improved to Osteopenia on last BD 06/21/2020, improved at all sites on Bone supplement and Bone strong Gym. Will repeat BD at 3 years.  4. Personal history of other medical treatment   Princess Bruins MD, 8:24 AM

## 2022-05-04 LAB — CYTOLOGY - PAP: Diagnosis: NEGATIVE

## 2022-05-06 DIAGNOSIS — H2513 Age-related nuclear cataract, bilateral: Secondary | ICD-10-CM | POA: Diagnosis not present

## 2022-05-06 DIAGNOSIS — H5319 Other subjective visual disturbances: Secondary | ICD-10-CM | POA: Diagnosis not present

## 2022-05-06 DIAGNOSIS — H04123 Dry eye syndrome of bilateral lacrimal glands: Secondary | ICD-10-CM | POA: Diagnosis not present

## 2022-05-06 DIAGNOSIS — H43811 Vitreous degeneration, right eye: Secondary | ICD-10-CM | POA: Diagnosis not present

## 2022-05-27 DIAGNOSIS — R509 Fever, unspecified: Secondary | ICD-10-CM | POA: Diagnosis not present

## 2022-05-27 DIAGNOSIS — R638 Other symptoms and signs concerning food and fluid intake: Secondary | ICD-10-CM | POA: Diagnosis not present

## 2022-05-27 DIAGNOSIS — R0981 Nasal congestion: Secondary | ICD-10-CM | POA: Diagnosis not present

## 2022-05-27 DIAGNOSIS — B349 Viral infection, unspecified: Secondary | ICD-10-CM | POA: Diagnosis not present

## 2022-05-27 DIAGNOSIS — R059 Cough, unspecified: Secondary | ICD-10-CM | POA: Diagnosis not present

## 2022-05-27 DIAGNOSIS — J029 Acute pharyngitis, unspecified: Secondary | ICD-10-CM | POA: Diagnosis not present

## 2022-06-13 DIAGNOSIS — K1379 Other lesions of oral mucosa: Secondary | ICD-10-CM | POA: Diagnosis not present

## 2022-08-13 DIAGNOSIS — B370 Candidal stomatitis: Secondary | ICD-10-CM | POA: Diagnosis not present

## 2023-02-16 DIAGNOSIS — M810 Age-related osteoporosis without current pathological fracture: Secondary | ICD-10-CM | POA: Diagnosis not present

## 2023-02-16 DIAGNOSIS — E049 Nontoxic goiter, unspecified: Secondary | ICD-10-CM | POA: Diagnosis not present

## 2023-02-16 DIAGNOSIS — R7989 Other specified abnormal findings of blood chemistry: Secondary | ICD-10-CM | POA: Diagnosis not present

## 2023-02-16 DIAGNOSIS — Z1212 Encounter for screening for malignant neoplasm of rectum: Secondary | ICD-10-CM | POA: Diagnosis not present

## 2023-02-16 DIAGNOSIS — E785 Hyperlipidemia, unspecified: Secondary | ICD-10-CM | POA: Diagnosis not present

## 2023-02-23 DIAGNOSIS — Z0000 Encounter for general adult medical examination without abnormal findings: Secondary | ICD-10-CM | POA: Diagnosis not present

## 2023-02-23 DIAGNOSIS — M810 Age-related osteoporosis without current pathological fracture: Secondary | ICD-10-CM | POA: Diagnosis not present

## 2023-02-23 DIAGNOSIS — E049 Nontoxic goiter, unspecified: Secondary | ICD-10-CM | POA: Diagnosis not present

## 2023-02-23 DIAGNOSIS — E785 Hyperlipidemia, unspecified: Secondary | ICD-10-CM | POA: Diagnosis not present

## 2023-02-23 DIAGNOSIS — Z1331 Encounter for screening for depression: Secondary | ICD-10-CM | POA: Diagnosis not present

## 2023-02-23 DIAGNOSIS — Z1339 Encounter for screening examination for other mental health and behavioral disorders: Secondary | ICD-10-CM | POA: Diagnosis not present

## 2023-02-23 DIAGNOSIS — R82998 Other abnormal findings in urine: Secondary | ICD-10-CM | POA: Diagnosis not present

## 2023-03-02 DIAGNOSIS — U071 COVID-19: Secondary | ICD-10-CM | POA: Diagnosis not present

## 2023-03-09 DIAGNOSIS — L570 Actinic keratosis: Secondary | ICD-10-CM | POA: Diagnosis not present

## 2023-03-09 DIAGNOSIS — D1801 Hemangioma of skin and subcutaneous tissue: Secondary | ICD-10-CM | POA: Diagnosis not present

## 2023-04-02 DIAGNOSIS — Z8262 Family history of osteoporosis: Secondary | ICD-10-CM | POA: Diagnosis not present

## 2023-04-02 DIAGNOSIS — M8588 Other specified disorders of bone density and structure, other site: Secondary | ICD-10-CM | POA: Diagnosis not present

## 2023-04-02 DIAGNOSIS — Z1231 Encounter for screening mammogram for malignant neoplasm of breast: Secondary | ICD-10-CM | POA: Diagnosis not present

## 2023-04-13 DIAGNOSIS — Z09 Encounter for follow-up examination after completed treatment for conditions other than malignant neoplasm: Secondary | ICD-10-CM | POA: Diagnosis not present

## 2023-04-13 DIAGNOSIS — D122 Benign neoplasm of ascending colon: Secondary | ICD-10-CM | POA: Diagnosis not present

## 2023-04-13 DIAGNOSIS — Z860101 Personal history of adenomatous and serrated colon polyps: Secondary | ICD-10-CM | POA: Diagnosis not present

## 2023-04-15 DIAGNOSIS — D122 Benign neoplasm of ascending colon: Secondary | ICD-10-CM | POA: Diagnosis not present

## 2023-04-21 IMAGING — RF DG KNEE 1-2V*R*
1 series · 5 of 5 positions shown · non-contrast
Comparison: Preoperative radiograph 10/05/2020

CLINICAL DATA: Intra op ORIF right patella.

EXAM:
RIGHT KNEE - 1-2 VIEW; DG C-ARM 1-60 MIN-NO REPORT

[Series 1: unknown protocol · 0.14mm/px · 5 of 5 slices shown]
[im 1/5]
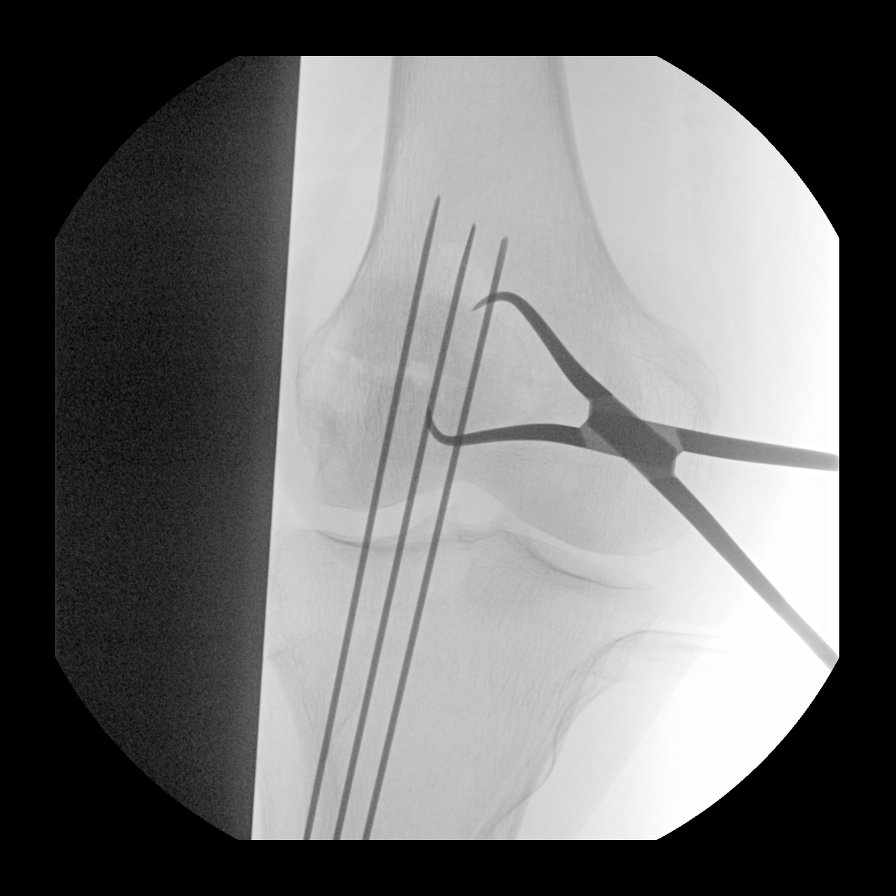
[im 2/5]
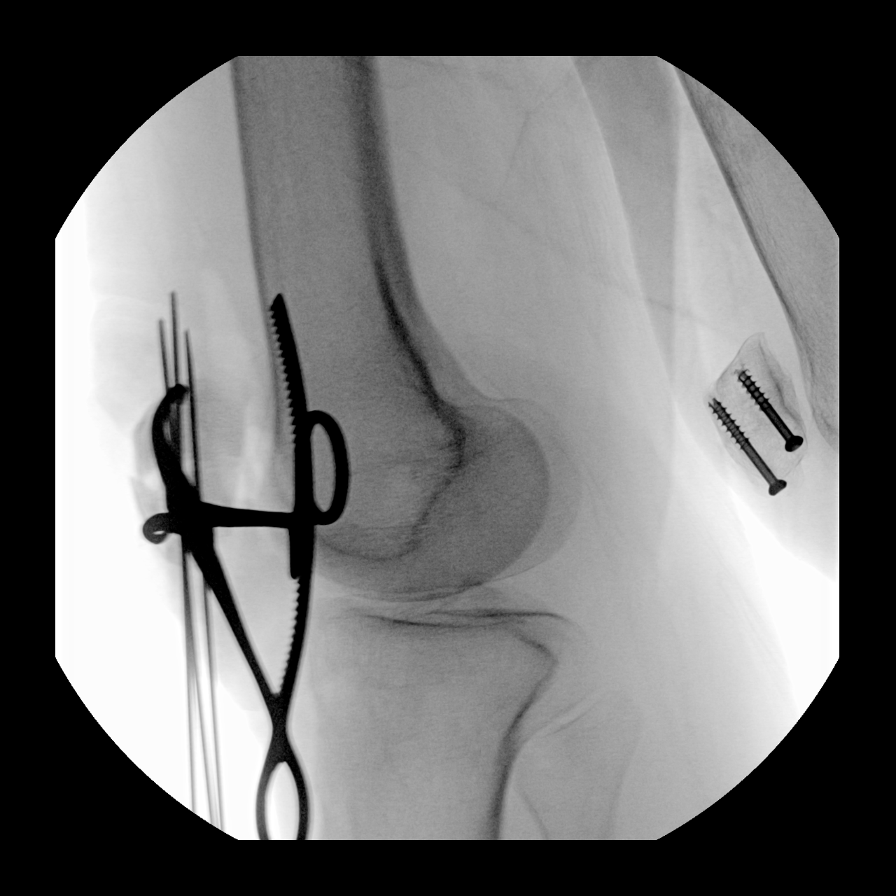
[im 3/5]
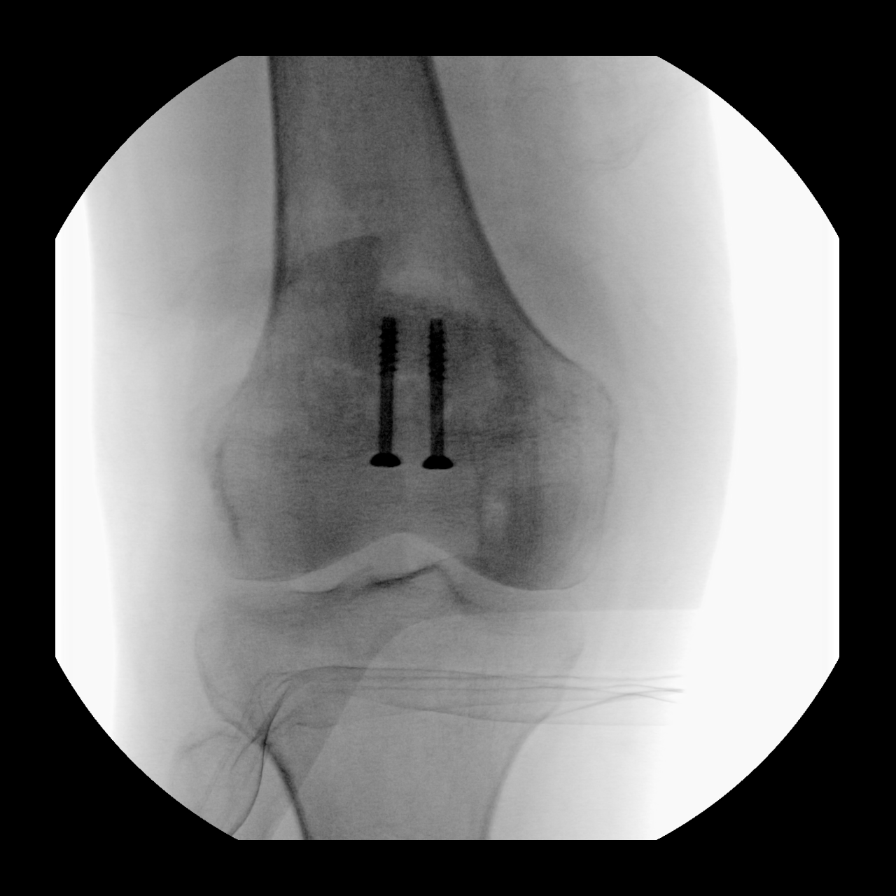
[im 4/5]
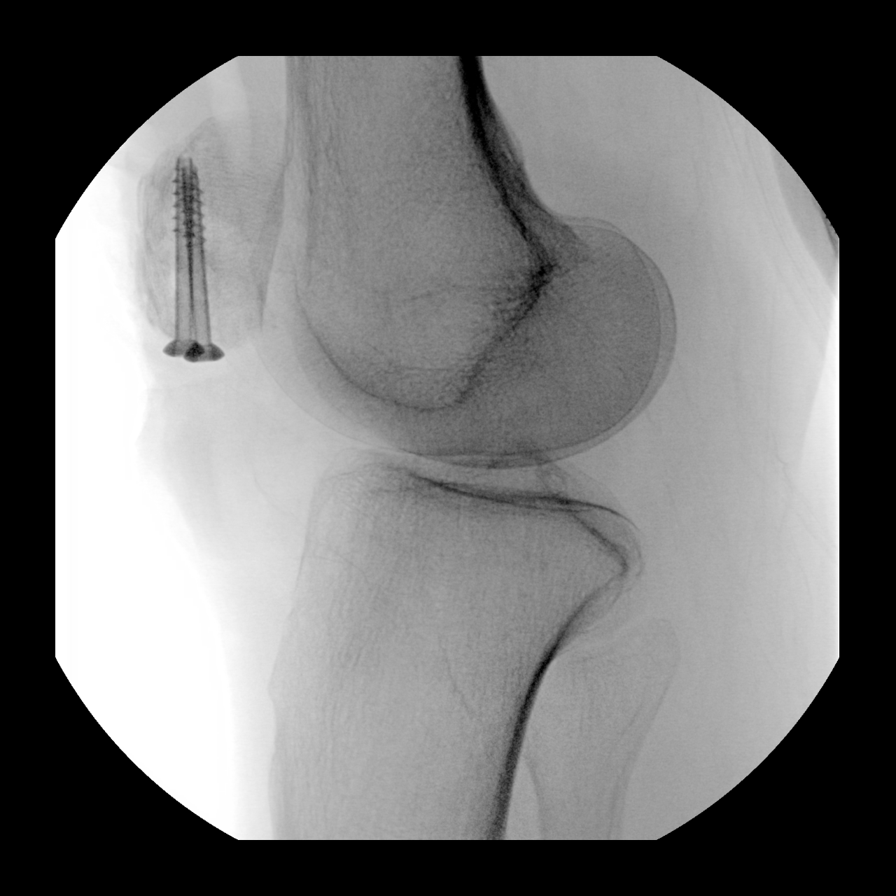
[im 5/5]
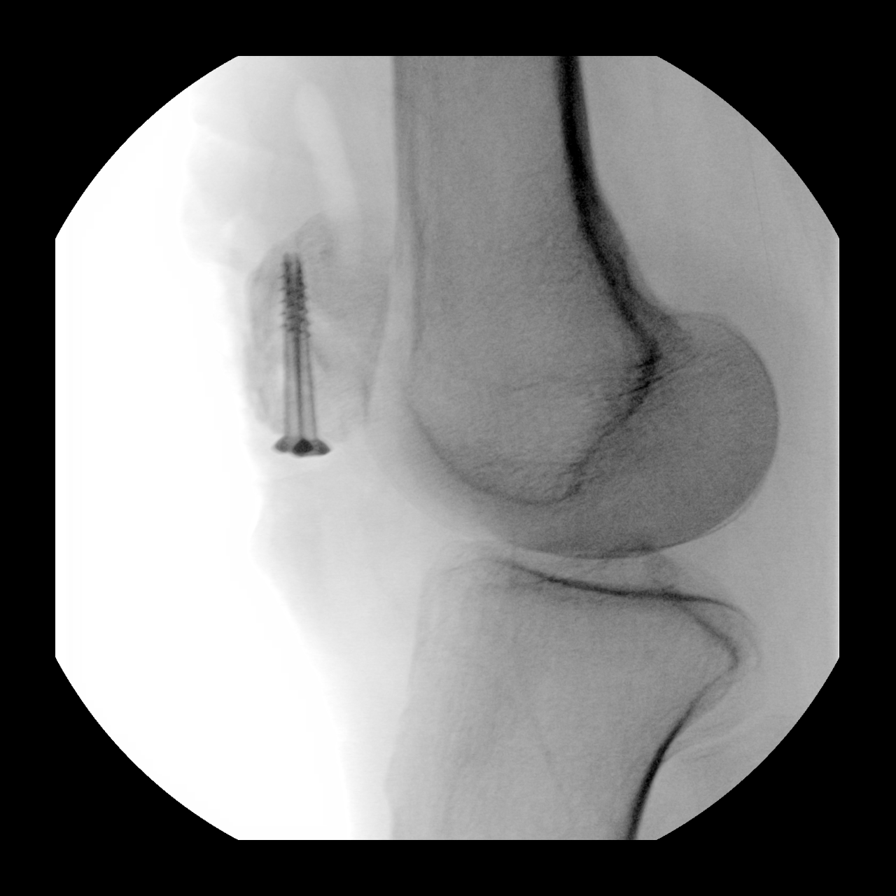

[5 of 5 positions shown; findings below may reference images not displayed]

FINDINGS: Five fluoroscopic spot views of the right knee obtained in the
operating room. Placement of 2 cannulated screws through mid
patellar fracture. Fluoroscopy time 38 seconds. Dose 2.1529 mGy.
IMPRESSION: Intraoperative fluoroscopy during ORIF of right patellar fracture.

## 2023-05-17 DIAGNOSIS — H43811 Vitreous degeneration, right eye: Secondary | ICD-10-CM | POA: Diagnosis not present

## 2023-05-17 DIAGNOSIS — H2513 Age-related nuclear cataract, bilateral: Secondary | ICD-10-CM | POA: Diagnosis not present

## 2023-05-17 DIAGNOSIS — H5319 Other subjective visual disturbances: Secondary | ICD-10-CM | POA: Diagnosis not present

## 2023-05-17 DIAGNOSIS — H04123 Dry eye syndrome of bilateral lacrimal glands: Secondary | ICD-10-CM | POA: Diagnosis not present

## 2023-08-04 DIAGNOSIS — F4321 Adjustment disorder with depressed mood: Secondary | ICD-10-CM | POA: Diagnosis not present

## 2023-08-04 DIAGNOSIS — R058 Other specified cough: Secondary | ICD-10-CM | POA: Diagnosis not present

## 2023-08-04 DIAGNOSIS — B001 Herpesviral vesicular dermatitis: Secondary | ICD-10-CM | POA: Diagnosis not present

## 2023-08-04 DIAGNOSIS — R682 Dry mouth, unspecified: Secondary | ICD-10-CM | POA: Diagnosis not present

## 2023-09-14 DIAGNOSIS — H65191 Other acute nonsuppurative otitis media, right ear: Secondary | ICD-10-CM | POA: Diagnosis not present

## 2023-09-14 DIAGNOSIS — H8110 Benign paroxysmal vertigo, unspecified ear: Secondary | ICD-10-CM | POA: Diagnosis not present

## 2023-12-16 DIAGNOSIS — H5319 Other subjective visual disturbances: Secondary | ICD-10-CM | POA: Diagnosis not present

## 2023-12-16 DIAGNOSIS — H04123 Dry eye syndrome of bilateral lacrimal glands: Secondary | ICD-10-CM | POA: Diagnosis not present

## 2023-12-16 DIAGNOSIS — H43811 Vitreous degeneration, right eye: Secondary | ICD-10-CM | POA: Diagnosis not present

## 2023-12-16 DIAGNOSIS — H2513 Age-related nuclear cataract, bilateral: Secondary | ICD-10-CM | POA: Diagnosis not present

## 2024-02-28 DIAGNOSIS — M810 Age-related osteoporosis without current pathological fracture: Secondary | ICD-10-CM | POA: Diagnosis not present

## 2024-02-28 DIAGNOSIS — E785 Hyperlipidemia, unspecified: Secondary | ICD-10-CM | POA: Diagnosis not present

## 2024-02-28 DIAGNOSIS — R7989 Other specified abnormal findings of blood chemistry: Secondary | ICD-10-CM | POA: Diagnosis not present

## 2024-02-28 DIAGNOSIS — E049 Nontoxic goiter, unspecified: Secondary | ICD-10-CM | POA: Diagnosis not present

## 2024-02-28 DIAGNOSIS — Z0189 Encounter for other specified special examinations: Secondary | ICD-10-CM | POA: Diagnosis not present

## 2024-02-28 DIAGNOSIS — Z1212 Encounter for screening for malignant neoplasm of rectum: Secondary | ICD-10-CM | POA: Diagnosis not present

## 2024-03-06 DIAGNOSIS — R82998 Other abnormal findings in urine: Secondary | ICD-10-CM | POA: Diagnosis not present

## 2024-03-06 DIAGNOSIS — Z1212 Encounter for screening for malignant neoplasm of rectum: Secondary | ICD-10-CM | POA: Diagnosis not present

## 2024-03-06 DIAGNOSIS — M810 Age-related osteoporosis without current pathological fracture: Secondary | ICD-10-CM | POA: Diagnosis not present

## 2024-03-06 DIAGNOSIS — E049 Nontoxic goiter, unspecified: Secondary | ICD-10-CM | POA: Diagnosis not present

## 2024-03-06 DIAGNOSIS — Z0189 Encounter for other specified special examinations: Secondary | ICD-10-CM | POA: Diagnosis not present

## 2024-04-03 DIAGNOSIS — H903 Sensorineural hearing loss, bilateral: Secondary | ICD-10-CM | POA: Diagnosis not present

## 2024-04-07 DIAGNOSIS — Z1231 Encounter for screening mammogram for malignant neoplasm of breast: Secondary | ICD-10-CM | POA: Diagnosis not present
